# Patient Record
Sex: Female | Born: 1995 | Hispanic: No | Smoking: Never smoker
Health system: Southern US, Community
[De-identification: ages and names within clinical notes are randomized; demographics above are authoritative.]

## PROBLEM LIST (undated history)

## (undated) ENCOUNTER — Emergency Department (HOSPITAL_COMMUNITY): Payer: Self-pay

## (undated) ENCOUNTER — Inpatient Hospital Stay (HOSPITAL_COMMUNITY): Payer: Self-pay

## (undated) DIAGNOSIS — Z789 Other specified health status: Secondary | ICD-10-CM

## (undated) HISTORY — PX: NO PAST SURGERIES: SHX2092

---

## 2020-09-25 ENCOUNTER — Emergency Department (HOSPITAL_COMMUNITY): Payer: Self-pay

## 2020-09-25 ENCOUNTER — Other Ambulatory Visit: Payer: Self-pay

## 2020-09-25 ENCOUNTER — Emergency Department (HOSPITAL_COMMUNITY)
Admission: EM | Admit: 2020-09-25 | Discharge: 2020-09-25 | Disposition: A | Discharge status: Home / Self Care | Payer: Self-pay | Attending: Emergency Medicine | Admitting: Emergency Medicine

## 2020-09-25 DIAGNOSIS — R112 Nausea with vomiting, unspecified: Secondary | ICD-10-CM | POA: Insufficient documentation

## 2020-09-25 DIAGNOSIS — R519 Headache, unspecified: Secondary | ICD-10-CM | POA: Insufficient documentation

## 2020-09-25 DIAGNOSIS — R55 Syncope and collapse: Secondary | ICD-10-CM | POA: Insufficient documentation

## 2020-09-25 DIAGNOSIS — Z5321 Procedure and treatment not carried out due to patient leaving prior to being seen by health care provider: Secondary | ICD-10-CM | POA: Insufficient documentation

## 2020-09-25 LAB — CBC WITH DIFFERENTIAL/PLATELET
Abs Immature Granulocytes: 0.04 10*3/uL (ref 0.00–0.07)
Basophils Absolute: 0 10*3/uL (ref 0.0–0.1)
Basophils Relative: 0 %
Eosinophils Absolute: 0.1 10*3/uL (ref 0.0–0.5)
Eosinophils Relative: 1 %
HCT: 40 % (ref 36.0–46.0)
Hemoglobin: 12.7 g/dL (ref 12.0–15.0)
Immature Granulocytes: 0 %
Lymphocytes Relative: 15 %
Lymphs Abs: 1.6 10*3/uL (ref 0.7–4.0)
MCH: 26.9 pg (ref 26.0–34.0)
MCHC: 31.8 g/dL (ref 30.0–36.0)
MCV: 84.7 fL (ref 80.0–100.0)
Monocytes Absolute: 0.8 10*3/uL (ref 0.1–1.0)
Monocytes Relative: 8 %
Neutro Abs: 8.1 10*3/uL — ABNORMAL HIGH (ref 1.7–7.7)
Neutrophils Relative %: 76 %
Platelets: 224 10*3/uL (ref 150–400)
RBC: 4.72 MIL/uL (ref 3.87–5.11)
RDW: 12.3 % (ref 11.5–15.5)
WBC: 10.6 10*3/uL — ABNORMAL HIGH (ref 4.0–10.5)
nRBC: 0 % (ref 0.0–0.2)

## 2020-09-25 LAB — COMPREHENSIVE METABOLIC PANEL
ALT: 22 U/L (ref 0–44)
AST: 16 U/L (ref 15–41)
Albumin: 3.6 g/dL (ref 3.5–5.0)
Alkaline Phosphatase: 52 U/L (ref 38–126)
Anion gap: 6 (ref 5–15)
BUN: 14 mg/dL (ref 6–20)
CO2: 24 mmol/L (ref 22–32)
Calcium: 8.6 mg/dL — ABNORMAL LOW (ref 8.9–10.3)
Chloride: 106 mmol/L (ref 98–111)
Creatinine, Ser: 0.67 mg/dL (ref 0.44–1.00)
GFR, Estimated: >60 mL/min (ref >60)
Glucose, Bld: 123 mg/dL — ABNORMAL HIGH (ref 70–99)
Potassium: 3.7 mmol/L (ref 3.5–5.1)
Sodium: 136 mmol/L (ref 135–145)
Total Bilirubin: 0.6 mg/dL (ref 0.3–1.2)
Total Protein: 6.5 g/dL (ref 6.5–8.1)

## 2020-09-25 LAB — I-STAT BETA HCG BLOOD, ED (MC, WL, AP ONLY): I-stat hCG, quantitative: <5 m[IU]/mL (ref <5)

## 2020-09-25 LAB — TROPONIN I (HIGH SENSITIVITY): Troponin I (High Sensitivity): <2 ng/L (ref <18)

## 2020-09-25 NOTE — ED Triage Notes (Signed)
Pt via EMS from home for witnessed syncopal episode at home. +orthostatic changes with EMS. Given 500cc NS by EMS. Has had a lot of coffee and little water this morning.

## 2020-09-25 NOTE — ED Notes (Signed)
Patient boyfriend stated that patient did not wont to stay, and asked to have IV removed. I stated that we would see ehr as soon as we had a room. He spoke to patient, and stated that she still would like to leave.

## 2020-09-25 NOTE — ED Notes (Signed)
Pt stated they wanted to leave and

## 2020-09-25 NOTE — ED Provider Notes (Signed)
Emergency Medicine Provider Triage Evaluation Note  Carolyn Carroll , a 25 y.o. female  was evaluated in triage.  Pt complains of syncope.  Patient was sitting at the table drinking coffee when she passed out from a sitting position.  Patient states since then, she has felt weak.  She has a mild headache which began after the syncope.  She did not have any chest pain before passing out.  She states for several days, she has had nausea and vomiting.    Review of Systems  Positive: Syncope, n/v, HA Negative: cp  Physical Exam  BP 104/69   Pulse 68   Temp 99.1 F (37.3 C)   Resp 17   SpO2 100%  Gen:   Awake, no distress   Resp:  Normal effort  MSK:   Moves extremities without difficulty  Other:  CN intact.  Grip strength equal.  Negative pronator drift.  Strength and sensation intact x4. No ttp of abd  Medical Decision Making  Medically screening exam initiated at 10:51 AM.  Appropriate orders placed.  Carolyn Carroll was informed that the remainder of the evaluation will be completed by another provider, this initial triage assessment does not replace that evaluation, and the importance of remaining in the ED until their evaluation is complete.  Labs, ct , cxr, ekg, ua ordered   Carolyn Apley, PA-C 09/25/20 1052    Carroll, Carolyn Brim, MD 09/25/20 [phone]

## 2021-03-06 NOTE — L&D Delivery Note (Addendum)
Delivery Note ?Carolyn Carroll [name] is a 26 y.o. G3P2002 at [redacted]w[redacted]d admitted for SOL.  ? ?GBS Status: Negative/-- (04/26 1621) ?Maximum Maternal Temperature: 98.3 F ? ?Labor course: Initial SVE: 9/90/-1 Bulging bag. Augmentation with: N/A. She then progressed to complete.  ?ROM: 0h 69m with clear fluid ? ?Birth: At 1721 a viable female was delivered via spontaneous vaginal delivery en caul (Presentation: ROA ). Nuchal cord present: No.  Shoulders and body delivered in usual fashion. Infant placed directly on mom's abdomen for bonding/skin-to-skin, baby dried and stimulated. Cord clamped x 2 after 1 minute and cut by FOB.  Cord blood collected.  The placenta separated spontaneously and delivered schultz via gentle cord traction.  Pitocin infused rapidly IV per protocol.  Fundus firm with massage.  ?Placenta inspected and appears to be intact with a 3 VC.  Placenta/Cord with the following complications: N/A .  Cord pH: N/A ?Sponge and instrument count were correct x2. ? ?Intrapartum complications:  None ?Anesthesia:  none ?Episiotomy: none ?Lacerations:  none ?Suture Repair:  N/A ?EBL (mL): 50 ? ? ?Infant: ?APGAR (1 MIN): 9   ?APGAR (5 MINS): 9   ?APGAR (10 MINS):    ?Infant weight: pending ? ?Mom to postpartum.  Baby to Couplet care / Skin to Skin. Placenta to L&D   ?Plans to Breast and bottlefeed ?Contraception:  undecided ?Circumcision: declines ? ?Note sent to [facility]: MCW for pp visit. ? ?Karl Pock , SNM ?07/03/2021 ?5:57 PM ? ?  ?

## 2021-03-09 ENCOUNTER — Encounter (HOSPITAL_COMMUNITY): Payer: Self-pay | Admitting: Obstetrics and Gynecology

## 2021-03-09 ENCOUNTER — Other Ambulatory Visit: Payer: Self-pay

## 2021-03-09 ENCOUNTER — Inpatient Hospital Stay (HOSPITAL_COMMUNITY)
Admission: AD | Admit: 2021-03-09 | Discharge: 2021-03-09 | Disposition: A | Discharge status: Home / Self Care | Payer: Self-pay | Attending: Obstetrics and Gynecology | Admitting: Obstetrics and Gynecology

## 2021-03-09 DIAGNOSIS — K5900 Constipation, unspecified: Secondary | ICD-10-CM | POA: Insufficient documentation

## 2021-03-09 DIAGNOSIS — M545 Low back pain, unspecified: Secondary | ICD-10-CM | POA: Insufficient documentation

## 2021-03-09 DIAGNOSIS — O0932 Supervision of pregnancy with insufficient antenatal care, second trimester: Secondary | ICD-10-CM | POA: Insufficient documentation

## 2021-03-09 DIAGNOSIS — R1030 Lower abdominal pain, unspecified: Secondary | ICD-10-CM | POA: Insufficient documentation

## 2021-03-09 DIAGNOSIS — O99891 Other specified diseases and conditions complicating pregnancy: Secondary | ICD-10-CM

## 2021-03-09 DIAGNOSIS — O26892 Other specified pregnancy related conditions, second trimester: Secondary | ICD-10-CM | POA: Insufficient documentation

## 2021-03-09 DIAGNOSIS — M549 Dorsalgia, unspecified: Secondary | ICD-10-CM

## 2021-03-09 DIAGNOSIS — Z3A17 17 weeks gestation of pregnancy: Secondary | ICD-10-CM | POA: Insufficient documentation

## 2021-03-09 HISTORY — DX: Other specified health status: Z78.9

## 2021-03-09 LAB — COMPREHENSIVE METABOLIC PANEL
ALT: 17 U/L (ref 0–44)
AST: 14 U/L — ABNORMAL LOW (ref 15–41)
Albumin: 2.9 g/dL — ABNORMAL LOW (ref 3.5–5.0)
Alkaline Phosphatase: 54 U/L (ref 38–126)
Anion gap: 8 (ref 5–15)
BUN: 9 mg/dL (ref 6–20)
CO2: 22 mmol/L (ref 22–32)
Calcium: 8.5 mg/dL — ABNORMAL LOW (ref 8.9–10.3)
Chloride: 104 mmol/L (ref 98–111)
Creatinine, Ser: 0.47 mg/dL (ref 0.44–1.00)
GFR, Estimated: >60 mL/min (ref >60)
Glucose, Bld: 98 mg/dL (ref 70–99)
Potassium: 3.5 mmol/L (ref 3.5–5.1)
Sodium: 134 mmol/L — ABNORMAL LOW (ref 135–145)
Total Bilirubin: 0.3 mg/dL (ref 0.3–1.2)
Total Protein: 6 g/dL — ABNORMAL LOW (ref 6.5–8.1)

## 2021-03-09 LAB — URINALYSIS, ROUTINE W REFLEX MICROSCOPIC
Bilirubin Urine: NEGATIVE
Glucose, UA: NEGATIVE mg/dL
Hgb urine dipstick: NEGATIVE
Ketones, ur: NEGATIVE mg/dL
Leukocytes,Ua: NEGATIVE
Nitrite: NEGATIVE
Protein, ur: NEGATIVE mg/dL
Specific Gravity, Urine: >1.03 — ABNORMAL HIGH (ref 1.005–1.030)
pH: 5.5 (ref 5.0–8.0)

## 2021-03-09 LAB — GC/CHLAMYDIA PROBE AMP (~~LOC~~) NOT AT ARMC
Chlamydia: NEGATIVE
Comment: NEGATIVE
Comment: NORMAL
Neisseria Gonorrhea: NEGATIVE

## 2021-03-09 LAB — CBC
HCT: 33.4 % — ABNORMAL LOW (ref 36.0–46.0)
Hemoglobin: 11.3 g/dL — ABNORMAL LOW (ref 12.0–15.0)
MCH: 28.1 pg (ref 26.0–34.0)
MCHC: 33.8 g/dL (ref 30.0–36.0)
MCV: 83.1 fL (ref 80.0–100.0)
Platelets: 214 10*3/uL (ref 150–400)
RBC: 4.02 MIL/uL (ref 3.87–5.11)
RDW: 13.2 % (ref 11.5–15.5)
WBC: 14.7 10*3/uL — ABNORMAL HIGH (ref 4.0–10.5)
nRBC: 0 % (ref 0.0–0.2)

## 2021-03-09 LAB — ABO/RH: ABO/RH(D): O POS

## 2021-03-09 LAB — WET PREP, GENITAL
Sperm: NONE SEEN
Trich, Wet Prep: NONE SEEN
WBC, Wet Prep HPF POC: <10 (ref <10)
Yeast Wet Prep HPF POC: NONE SEEN

## 2021-03-09 MED ORDER — CYCLOBENZAPRINE HCL 5 MG PO TABS
ORAL_TABLET | ORAL | Status: AC
  Filled 2021-03-09: qty 1

## 2021-03-09 MED ORDER — CYCLOBENZAPRINE HCL 5 MG PO TABS: 5.0000 mg | ORAL_TABLET | Freq: Three times a day (TID) | ORAL | 0 refills | Status: DC | PRN

## 2021-03-09 MED ORDER — CYCLOBENZAPRINE HCL 5 MG PO TABS
5.0000 mg | ORAL_TABLET | Freq: Once | ORAL | Status: AC
  Administered 2021-03-09: 5 mg via ORAL

## 2021-03-09 NOTE — MAU Note (Signed)
Having lower back and lower abd pain since 2330. Denies any VB. Pain is crampy. LMP 11/06/20. Has not started care yet. Has had 2 normal vag del in [place].

## 2021-03-09 NOTE — MAU Provider Note (Signed)
Chief Complaint:  Abdominal Pain and Back Pain   Event Date/Time   First Provider Initiated Contact with Patient 03/09/21 0136    Interpretor used throughout   HPI: Carolyn Carroll is a 26 y.o. G3P2002 at [id number] presents to maternity admissions reporting low back pain which wraps around to middle abdomen. No pelvic pain.  Normal vaginal discharge, no bleeding. . She denies LOF, vaginal bleeding, vaginal itching/burning, urinary symptoms, h/a, dizziness, n/v, diarrhea, constipation or fever/chills.   Abdominal Pain This is a new problem. The current episode started today. The problem occurs constantly. The problem has been unchanged. The quality of the pain is cramping and aching. The abdominal pain does not radiate. Associated symptoms include constipation. Pertinent negatives include no diarrhea, dysuria, fever, frequency, headaches or nausea. Nothing aggravates the pain. The pain is relieved by Nothing. She has tried nothing for the symptoms.  Back Pain This is a new problem. The current episode started today. The problem is unchanged. The quality of the pain is described as aching and cramping. Stiffness is present All day. Associated symptoms include abdominal pain. Pertinent negatives include no dysuria, fever, headaches or paresis. She has tried nothing for the symptoms.   RN note: Having lower back and lower abd pain since 2330. Denies any VB. Pain is crampy. LMP 11/06/20. Has not started care yet. [place] 2 [place] del [place]   Past Medical History: Past Medical History:  Diagnosis Date   Medical history non-contributory     Past obstetric history: OB History  Gravida Para Term Preterm AB Living  3 2 2     2   SAB IAB Ectopic Multiple Live Births          2    # Outcome Date GA Lbr Len/2nd Weight Sex Delivery Anes PTL Lv  3 Current           2 Term 04/13/17    M Vag-Spont   LIV  1 Term 06/30/12    Genella MechM Vag-Spont   LIV    Past Surgical History: Past  Surgical History:  Procedure Laterality Date   NO PAST SURGERIES      Family History: History reviewed. No pertinent family history.  Social History: Social History   Tobacco Use   Smoking status: Never   Smokeless tobacco: Never  Vaping Use   Vaping Use: Never used  Substance Use Topics   Alcohol use: Never   Drug use: Never    Allergies: Not on File  Meds:  Medications Prior to Admission  Medication Sig Dispense Refill Last Dose   Prenatal Vit-Fe Fumarate-FA (PRENATAL MULTIVITAMIN) TABS tablet Take 1 tablet by mouth daily at 12 noon.   03/08/2021    I have reviewed patient's Past Medical Hx, Surgical Hx, Family Hx, Social Hx, medications and allergies.   ROS:  Review of Systems  Constitutional:  Negative for fever.  Gastrointestinal:  Positive for abdominal pain and constipation. Negative for diarrhea and nausea.  Genitourinary:  Negative for dysuria and frequency.  Musculoskeletal:  Positive for back pain.  Neurological:  Negative for headaches.  Other systems negative  Physical Exam  Patient Vitals for the past 24 hrs:  BP Temp Pulse Resp SpO2 Height Weight  03/09/21 0045 117/75 (!) 97.4 F (36.3 C) 71 16 100 % 4\' 9"  (1.448 m) 50.3 kg   Constitutional: Well-developed, well-nourished female in no acute distress.  Cardiovascular: normal rate and rhythm Respiratory: normal effort, clear to auscultation bilaterally GI: Abd soft,  non-tender, gravid appropriate for gestational age.   No rebound or guarding. MS: Extremities nontender, no edema, normal ROM   + muscle tension in mid back muscles Neurologic: Alert and oriented x 4.  GU: Neg CVAT.  PELVIC EXAM: No bleeding or abnormal discharge   Bimanual exam: Cervix firm, posterior, neg CMT, uterus nontender, Fundal Height consistent with dates, adnexa without tenderness, enlargement, or mass    FHT:   147  Labs: Results for orders placed or performed during the hospital encounter of 03/09/21 (from the past 24  hour(s))  Urinalysis, Routine w reflex microscopic Urine, Clean Catch     Status: Abnormal   Collection Time: 03/09/21 12:56 AM  Result Value Ref Range   Color, Urine YELLOW YELLOW   APPearance CLEAR CLEAR   Specific Gravity, Urine >1.030 (H) 1.005 - 1.030   pH 5.5 5.0 - 8.0   Glucose, UA NEGATIVE NEGATIVE mg/dL   Hgb urine dipstick NEGATIVE NEGATIVE   Bilirubin Urine NEGATIVE NEGATIVE   Ketones, ur NEGATIVE NEGATIVE mg/dL   Protein, ur NEGATIVE NEGATIVE mg/dL   Nitrite NEGATIVE NEGATIVE   Leukocytes,Ua NEGATIVE NEGATIVE  Wet prep, genital     Status: Abnormal   Collection Time: 03/09/21  1:28 AM   Specimen: PATH Cytology Cervicovaginal Ancillary Only  Result Value Ref Range   Yeast Wet Prep HPF POC NONE SEEN NONE SEEN   Trich, Wet Prep NONE SEEN NONE SEEN   Clue Cells Wet Prep HPF POC PRESENT (A) NONE SEEN   WBC, Wet Prep HPF POC <10 <10   Sperm NONE SEEN   CBC     Status: Abnormal   Collection Time: 03/09/21  2:23 AM  Result Value Ref Range   WBC 14.7 (H) 4.0 - 10.5 K/uL   RBC 4.02 3.87 - 5.11 MIL/uL   Hemoglobin 11.3 (L) 12.0 - 15.0 g/dL   HCT 09.8 (L) 11.9 - 14.7 %   MCV 83.1 80.0 - 100.0 fL   MCH 28.1 26.0 - 34.0 pg   MCHC 33.8 30.0 - 36.0 g/dL   RDW 82.9 56.2 - 13.0 %   Platelets 214 150 - 400 K/uL   nRBC 0.0 0.0 - 0.2 %  ABO/Rh     Status: None   Collection Time: 03/09/21  2:23 AM  Result Value Ref Range   ABO/RH(D)      O POS Performed at [facility], [address] [place], [place] [address]   Comprehensive metabolic panel     Status: Abnormal   Collection Time: 03/09/21  2:23 AM  Result Value Ref Range   Sodium 134 (L) 135 - 145 mmol/L   Potassium 3.5 3.5 - 5.1 mmol/L   Chloride 104 98 - 111 mmol/L   CO2 22 22 - 32 mmol/L   Glucose, Bld 98 70 - 99 mg/dL   BUN 9 6 - 20 mg/dL   Creatinine, Ser 4.69 0.44 - 1.00 mg/dL   Calcium 8.5 (L) 8.9 - 10.3 mg/dL   Total Protein 6.0 (L) 6.5 - 8.1 g/dL   Albumin 2.9 (L) 3.5 - 5.0 g/dL   AST 14 (L) 15 - 41  U/L   ALT 17 0 - 44 U/L   Alkaline Phosphatase 54 38 - 126 U/L   Total Bilirubin 0.3 0.3 - 1.2 mg/dL   GFR, Estimated >62 >95 mL/min   Anion gap 8 5 - 15     Imaging:  No results found.  MAU Course/MDM: I have ordered labs and reviewed  results. They are normal   Treatments in MAU included Flexeril.  This did relieve her pain.  Discussed warning signs to return for.  Assessment: Single IUP at [redacted]w[redacted]d Back pain related to spasm No prenatal care  Plan: Discharge home Rx Flexeril for prn use at home for spasm Encouraged to start prenatal care Preterm Labor precautions and fetal kick counts Follow up in Office for prenatal visits  Encouraged to return if she develops worsening of symptoms, increase in pain, fever, or other concerning symptoms.  Pt stable at time of discharge.  Wynelle Bourgeois CNM, MSN Certified Nurse-Midwife 03/09/2021 1:36 AM

## 2021-04-19 ENCOUNTER — Other Ambulatory Visit (HOSPITAL_COMMUNITY)
Admission: RE | Admit: 2021-04-19 | Discharge: 2021-04-19 | Disposition: A | Discharge status: Home / Self Care | Payer: Self-pay | Source: Ambulatory Visit | Attending: Family Medicine | Admitting: Family Medicine

## 2021-04-19 ENCOUNTER — Other Ambulatory Visit: Payer: Self-pay

## 2021-04-19 ENCOUNTER — Ambulatory Visit (INDEPENDENT_AMBULATORY_CARE_PROVIDER_SITE_OTHER): Payer: Self-pay

## 2021-04-19 DIAGNOSIS — Z3480 Encounter for supervision of other normal pregnancy, unspecified trimester: Secondary | ICD-10-CM | POA: Insufficient documentation

## 2021-04-19 NOTE — Progress Notes (Signed)
New OB Intake  I connected with  Clearence Cheek on 04/19/21 at  8:15 AM EST by In Person Visit and verified that I am speaking with the correct person using two identifiers. Nurse is located at [facility] and pt is located at [employer] [facility].  I discussed the limitations, risks, security and privacy concerns of performing an evaluation and management service by telephone and the availability of in person appointments. I also discussed with the patient that there may be a patient responsible charge related to this service. The patient expressed understanding and agreed to proceed.  I explained I am completing New OB Intake today. We discussed her EDD of 08/13/21 that is based on LMP of 11/06/20. Pt is G3/P2. I reviewed her allergies, medications, Medical/Surgical/OB history, and appropriate screenings. I informed her of [facility] services. Based on history, this is a/an  pregnancy uncomplicated .   Patient Active Problem List   Diagnosis Date Noted   Back pain affecting pregnancy 03/09/2021    Concerns addressed today  Delivery Plans:  Plans to deliver at [facility].   MyChart/Babyscripts MyChart access verified. I explained pt will have some visits in office and some virtually. Babyscripts instructions given and order placed. Patient verifies receipt of registration text/e-mail. Account successfully created and app downloaded.  Blood Pressure Cuff  Patient is self-pay; explained patient will be given BP cuff at first prenatal appt. Explained after first prenatal appt pt will check weekly and document in 66.  Weight scale: Patient does / does not  have weight scale. Weight scale ordered for patient to pick up from [employer].   Anatomy US Explained first scheduled [place] will be around 19 weeks. Anatomy US scheduled for 05/04/21 at 0830a. Pt notified to arrive at 0815a. Scheduled AFP lab only appointment if CenteringPregnancy pt for same day as anatomy US.   Labs Discussed Johnsie Cancel genetic screening  with patient. Would like both Panorama and Horizon drawn at new OB visit.Also if interested in genetic testing, tell patient she will need AFP 15-21 weeks to complete genetic testing .Routine prenatal labs needed.  Covid Vaccine Patient has covid vaccine.   CenteringPregnancy Candidate?  If yes, offer as possibility  Mother/ Baby Dyad Candidate?    If yes, offer as possibility  Informed patient of [facility] website  and placed link in her AVS.   Social Determinants of Health Food Insecurity: Patient expresses food insecurity. Food Market information given to patient; explained patient may visit at the end of first OB appointment. WIC Referral: Patient is interested in referral to [facility].  Transportation: Patient denies transportation needs. Childcare: Discussed no children allowed at ultrasound appointments. Offered childcare services; patient declines childcare services at this time.  Send link to Pregnancy Navigators   Placed OB Box on problem list and updated  First visit review I reviewed new OB appt with pt. I explained she will have a pelvic exam, ob bloodwork with genetic screening, and PAP smear. Explained pt will be seen by Dr. Roselie Awkward at first visit; encounter routed to appropriate provider. Explained that patient will be seen by pregnancy navigator following visit with provider. [facility] information placed in AVS.   Bethanne Ginger, [place] 04/19/2021  8:31 AM

## 2021-04-19 NOTE — Patient Instructions (Signed)
AREA PEDIATRIC/FAMILY PRACTICE PHYSICIANS  Central/[place]outheast [place] ([address]) [facility] Chambliss, MD; Eniola, MD; Hale, MD; Hensel, MD; McDiarmid, MD; McIntyer, MD; Ashyia Schraeder, MD; Walden, MD [address] [place], [place] [address] [phone] Mon-Fri 8:30-12:30, 1:30-5:00 Providers come to see babies at [facility] Accepting Medicaid [facility] providers who accept newborns: Koirala, MD; Morrow, MD; Wolters, MD [address], [place], [place] [address] [phone] Mon-Fri 8:00-5:30 Babies seen by providers at [facility] Does NOT accept Medicaid Please call early in hospitalization for appointment (limited availability)  [facility] Mulberry, MD [address] [place], [place] [address] [phone] Mon, Tue, Thur, Fri 8:30-5:00, Wed 10:00-7:00 (closed 1-2pm) Babies seen by [facility] providers Accepting Medicaid Rubin - Pediatrician Rubin, MD [address], [place], [place] [address] [phone] Mon-Fri 8:30-5:00, Sat 8:30-12:00 Provider comes to see babies at [facility] Accepting Medicaid Must have been referred from current patients or contacted office prior to delivery [facility] ([facility]) Brown, MD; Chandler, MD; Ettefagh, MD; Grant, MD; Lester, MD; McCormick, MD; McQueen, MD; Prose, MD; Simha, MD; Stanley, MD; Stryffeler, NP; Tebben, NP [address], [place], [place] [address] [phone] Mon, Tue, Thur, Fri 8:30-5:30, Wed 9:30-5:30, Sat 8:30-12:30 Babies seen by [facility] providers Accepting Medicaid Only accepting infants of first-time parents or siblings of current patients Hospital discharge coordinator will make follow-up appointment Jack Amos [address], [place], [place]  [address] [phone]   Fax - [phone] [facility] [address], [place], [place]  [address] Phone - [phone]   Fax - [phone] Shilpa Gosrani [address], [place], [place]  [address] [phone]  [place]heast [place] ([address]) [facility] Bates, MD; Brassfield, MD; Cooper, Cox, MD; MD; Davis, MD; Dovico, MD; Ettefaugh, MD; Little, MD; Lowe, MD; Keiffer, MD; Melvin, MD; Sumner, MD; Williams, MD [address], [place], [place] [address] [phone] Mon-Fri 8:30-5:00 (extended evenings Mon-Thur as needed), Sat-Sun 10:00-1:00 Providers come to see babies at [facility] Accepting Medicaid for families of first-time babies and families with all children in the household age 3 and under. Must register with office prior to making appointment (M-F only). [facility] Henson, NP; Knapp, MD; Lalonde, MD; Tysinger, PA [address] [place], [place] [address] [phone] Mon-Fri 8:00-5:00 Babies seen by providers at [facility] Does NOT accept Medicaid/Commercial Insurance Only [facility] ([facility])  Artis, MD; Barnes, MD; Bratton, MD; Coccaro, MD; Lockett Gardner, MD; Kramer, MD; Marshall, MD; Netherton, MD; Poleto, MD; Skinner, MD [address] [place], [place] [address] [phone] Mon-Fri 8:30-5:30, Sat (Oct.-Mar.) 9:00-1:00 Babies seen by providers at [facility] Accepting Medicaid  [place] ([address]) [facility] Reid, MD; Warner, MD [address], [place], [place] [address] [phone] Mon-Fri 8:30-5:00, Sat 8:30-12:00 Providers come to see babies at [facility] Does NOT accept Medicaid [facility] Becker, PA; Hagler, MD; Scifres, PA; Sun, MD; Swayne, MD [address], [place], [place] [address] [phone] Mon-Fri 8:00-5:00 Babies seen by providers at [facility] Does NOT accept Medicaid Only accepting babies of parents who  are patients Please call early in hospitalization for appointment (limited availability)  Pediatricians Clark, MD; Frye, MD; Kelleher, MD; Mack, NP; Miller, MD; O'Keller, MD; Patterson, NP; Pudlo, MD; Puzio, MD; Thomas, MD; Tucker, MD; Twiselton, MD [address], [place], [place] [address] [phone] Mon-Fri 8:00-5:00, Sat 9:00-12:00 Providers come to see babies at [facility] Does NOT accept Medicaid  [place] ([address]) [facility] providers accepting new patients: Brake, NP; Wharton, PA [address], [place], [place] [address] [phone] Mon-Fri 8:00-5:00 Babies seen by providers at [facility] Does NOT accept Medicaid Only accepting babies of parents who are patients Please call early in hospitalization for appointment (limited availability) [facility] Gay, MD; Quinlan, MD [address] [place], [place] [address] [phone] (press [phone] to schedule appointment) Mon-Fri 8:00-5:00 Providers come to see babies at [facility] Does NOT accept Medicaid [facility] Mazer, MD [address] [place], [place] [address] [phone] Mon-Fri 8:30-5:00 (lunch 12:30-1:00), extended hours by appointment only Wed 5:00-6:30 Babies seen by [facility] providers Accepting Medicaid [facility] Banks, MD; Jordan, MD; Koberlein, MD [address], [place], [place] [address] [phone] Mon-Fri 8:00-5:00 Babies seen by [facility] providers Does NOT accept Medicaid [facility] Parker, MD; Hunter, MD; Wallace, DO [address] [place], [place] [address] [phone] Mon-Fri 8:00-5:00 Babies seen by [facility] providers Does NOT accept Medicaid [facility] Brandon, PA; Brecken, PA; Christy, NP; Dees, MD; DeClaire, MD; DeWeese, MD; Hansen, NP; Mills, NP; Parrish, NP; Smoot, NP; Summer, MD; Vapne,  MD [address] [place], [place] [address] [phone] Mon-Fri 8:30-5:00, Sat 10:00-1:00 Providers come to see babies at [facility] Does NOT accept Medicaid Free prenatal information session Tuesdays at 4:45pm [facility] Bouska, MD; Gordon, PA; Jeffery, PA; Weber, PA [address] [place] [address] [phone] Mon-Fri 7:30-5:30 Babies seen by [facility] providers [facility] [address], [place], [place]  [address] [phone]   Fax - [phone]  [place] ([address] & [address]) [facility] [place] [address] [place], [place] [address] [phone] Mon-Thur 8:00-6:00 Providers come to see babies at [facility] Accepting Medicaid [facility] Anderson, NP; Badger, MD; Beal, PA; Spencer, PA [address] [place], [place] [address] [phone] Mon-Thur 7:30-7:30, Fri 7:30-4:30 Babies seen by [facility] providers Accepting Medicaid [facility] Agbuya, MD; Klett, NP; Romgoolam, MD [address], [place], [place] [address] [phone] Mon-Fri 8:30-5:00, Sat 8:30-12:00 Providers come to see babies at [facility] Accepting Medicaid Must have "Meet & Greet" appointment at office prior to delivery [facility] ([facility]) McCord, MD; Wallace, MD; Wood, MD [address], [place], [place] [address] [phone] Mon-Wed 8:00-6:00, Thur-Fri 8:00-5:00, Sat 9:00-12:00 Providers come to see babies at [facility] Does NOT accept Medicaid Only accepting siblings of current patients [facility]  [address], [place], [place]  [address] [phone]   Fax - [phone] [facility] [address], [place], [place]  [address] [phone]   Fax -  [phone]  [place] ([address] & [address]) [facility] Cirigliano, DO; Matthews, DO [address] [place], [place] [address] [phone] Mon-Fri 7:00-5:00 Babies seen by [facility] providers Does NOT accept Medicaid [facility] Briscoe, MD; Howley, PA; Moreira, PA [address], [place],  [address] [phone] Mon-Fri 8:00-5:00 Babies seen by [facility] providers Accepting Medicaid [facility] Boyd, MD; Church, PA; Jones, NP; Osborn, PA [address], ,  [address] [phone] Mon-Fri 8:00-5:00 Babies seen by providers at [facility] Accepting Medicaid  [place] ([address]) [facility] [place] Wendling, DO [address] [place], [place] [address] [phone] Mon-Fri 8:00-5:00 Babies seen by [facility] providers Does NOT accept Medicaid Limited availability, please call early in hospitalization to schedule follow-up [facility] Calderon, PA; Cummings, MD; Dillard, MD; Martin, PA; Olson, MD; VanDeven, PA [address], [place], [place] [address] [phone] Mon-Fri 8:30-5:00, Sat 9:00-12:00 Babies seen by providers at [facility] Accepting Medicaid Please register online then schedule online or call office [email or web address] Hunter, NP; Kumar, MD; Martin Rogers, PA [address], [place], [place] [address] [phone] Mon-Fri 8:00-5:00 Babies seen by providers at [facility] Accepting Medicaid [facility] ([facility]) Hilmar-Irwin, MD; Kristi Fleenor, NP; West, MD [address], [place], [place] [address] [phone] Mon-Fri 8:00-5:30, Sat&Sun by appointment (phones open at  8:30) Babies seen by [facility] providers Accepting Medicaid Must be a first-time baby or sibling of current patient [facility] [place]  [address], [place], [place]  [address] [phone]   Fax - [phone]  [place] ([address]) [facility] Brown, PA; Cowen, PA; Rice, MD; Helton, PA; Spry, MD [address] [place], [place] [address] [phone] Mon-Thur 8:00-7:00, Fri 8:00-5:00, Sat 8:00-12:00, Sun 9:00-12:00 Babies seen by [facility] providers Accepting Medicaid [facility] Coe-Goins, MD; Marshall, MD; Pierre-Louis, MD [address], [place], [place] [address] [phone] Mon-Thur 8:00-5:00 Babies seen by providers at [facility] Accepting Medicaid [facility] Bratton, MD; Coe-Goins, MD; Hayes, MD; Lewis, MD; List, MD; Lott, MD; Marshall, MD; Moran, MD; O'Josephina Melcher, MD; Pierre-Louis, MD; Pitonzo, MD; Scholer, MD; Spangle, MD [address] [place], [place] [address] [phone] Mon-Fri 8:00-5:30, Sat (Oct.-Mar.) 9:00-1:00 Babies seen by providers at [facility] Accepting Medicaid Must fill out new patient packet, available online at [email or web address] [facility] ([facility]) Friddle, NP; Harris, NP; Kelly, NP; Logan, MD; Melvin, PA; Poth, MD; Ramadoss, MD; Stanton, NP [address], [place], [place] [address] [phone] Mon-Thur 8:00-5:30, Fri 8:00-5:00 Babies seen by providers at [facility] Accepting Medicaid  [place] ([address]) [facility] Dixon, PA; Fate, MD; Pickard, MD; Tapia, PA [address], [place], [place] [address] [phone] Mon-Fri 8:00-5:00 Babies seen by providers at [facility] Accepting Medicaid   [place] ([address]) [facility] [place] Masneri, DO; Meyers, MD; Nelson, PA [address], [place], [place] [address] [phone] Mon-Fri 8:00-5:00 Babies seen by providers at [facility] Does NOT accept Medicaid Limited appointment availability, please call early in hospitalization  [facility] [place] Kunedd, DO; McGowen, MD [address], [place], [place] [address] [phone] Mon-Fri 8:00-5:00 Babies seen by [facility] providers Does NOT accept Medicaid [facility] [place] Cameron, MD; MacDonald, MD; Michaels, PA; Nayak, MD [address] [place] [address], [place], [place] [address] [phone] Mon-Fri 8:00-5:00 After hours clinic ([address] [place],  [address]) [phone] Mon-Fri 5:00-8:00, Sat 12:00-6:00, Sun 10:00-4:00 Babies seen by [facility] providers Accepting Medicaid [facility] [place] [address], [place], [place]  [address] [phone]   Fax - [phone]  [place] ([address]) [facility] [place] [facility] Andy, MD [address], [place], [place] [address] [phone] Mon-Fri 8:00-5:00 Babies seen by [facility] providers Does NOT accept Medicaid [facility] [place] ([facility] [place]) Eksir, MD [address], [place], [place] [address] [phone] Mon-Thur 8:00-7:00, Fri 8:00-5:00, Sat 8:00-12:00 Babies seen by providers at [facility] Accepting Medicaid - but does not have vaccinations in office (must be received elsewhere) Limited availability, please call early in hospitalization  [place] ([address]) [facility]  Charlene Flemming, MD [address] [place] [address], [place] [address] [phone]  Fax [phone]  [facility]  Kimberly Newton, MD, Annamarie Streilein, PA, Carla [place], PA [address] [place], [place]  [address] [phone] [facility]  [address], [place], [place] [address] [phone] [address], [place], [place] [address] [phone] (West Office)  [facility] [address], [place], [place] [address] [phone] [facility] [address], [place], [place] [address] [phone] [facility] [address], [place], [place] [address] [phone] [facility] [address], [place], [place] [address] [phone] [facility] [address], [place], [place] [address] [phone] [facility] [address], [place], [place] [address] [phone] [facility]  [address], [place], [place] [address] [phone] Dr[facility] [address] [place] [address], [place], [place] [address] [phone] [facility] [address], [place], [place] [address] [phone] [facility] [address], ,  [address] [phone]  

## 2021-04-20 LAB — CBC/D/PLT+RPR+RH+ABO+RUBIGG...
Antibody Screen: NEGATIVE
Basophils Absolute: 0 10*3/uL (ref 0.0–0.2)
Basos: 0 %
EOS (ABSOLUTE): 0 10*3/uL (ref 0.0–0.4)
Eos: 0 %
HCV Ab: NONREACTIVE
HIV Screen 4th Generation wRfx: NONREACTIVE
Hematocrit: 36.5 % (ref 34.0–46.6)
Hemoglobin: 12.1 g/dL (ref 11.1–15.9)
Hepatitis B Surface Ag: NEGATIVE
Immature Grans (Abs): 0 10*3/uL (ref 0.0–0.1)
Immature Granulocytes: 0 %
Lymphocytes Absolute: 1.3 10*3/uL (ref 0.7–3.1)
Lymphs: 18 %
MCH: 27.7 pg (ref 26.6–33.0)
MCHC: 33.2 g/dL (ref 31.5–35.7)
MCV: 84 fL (ref 79–97)
Monocytes Absolute: 0.7 10*3/uL (ref 0.1–0.9)
Monocytes: 10 %
Neutrophils Absolute: 4.9 10*3/uL (ref 1.4–7.0)
Neutrophils: 72 %
Platelets: 204 10*3/uL (ref 150–450)
RBC: 4.37 x10E6/uL (ref 3.77–5.28)
RDW: 12.5 % (ref 11.7–15.4)
RPR Ser Ql: NONREACTIVE
Rh Factor: POSITIVE
Rubella Antibodies, IGG: 6.5 index (ref >0.99)
WBC: 6.9 10*3/uL (ref 3.4–10.8)

## 2021-04-20 LAB — GC/CHLAMYDIA PROBE AMP (~~LOC~~) NOT AT ARMC
Chlamydia: NEGATIVE
Comment: NEGATIVE
Comment: NORMAL
Neisseria Gonorrhea: NEGATIVE

## 2021-04-20 LAB — HEMOGLOBIN A1C
Est. average glucose Bld gHb Est-mCnc: 108 mg/dL
Hgb A1c MFr Bld: 5.4 % (ref 4.8–5.6)

## 2021-04-20 LAB — HCV INTERPRETATION

## 2021-04-21 LAB — CULTURE, OB URINE

## 2021-04-21 LAB — URINE CULTURE, OB REFLEX

## 2021-05-04 ENCOUNTER — Other Ambulatory Visit: Payer: Self-pay

## 2021-05-04 ENCOUNTER — Ambulatory Visit: Payer: Self-pay | Attending: Obstetrics & Gynecology

## 2021-05-04 ENCOUNTER — Ambulatory Visit: Payer: Self-pay | Admitting: *Deleted

## 2021-05-04 ENCOUNTER — Encounter: Payer: Self-pay | Admitting: *Deleted

## 2021-05-04 ENCOUNTER — Other Ambulatory Visit: Payer: Self-pay | Admitting: *Deleted

## 2021-05-04 VITALS — BP 107/56 | HR 71

## 2021-05-04 DIAGNOSIS — Z3A29 29 weeks gestation of pregnancy: Secondary | ICD-10-CM | POA: Insufficient documentation

## 2021-05-04 DIAGNOSIS — Z3689 Encounter for other specified antenatal screening: Secondary | ICD-10-CM

## 2021-05-04 DIAGNOSIS — Z363 Encounter for antenatal screening for malformations: Secondary | ICD-10-CM | POA: Insufficient documentation

## 2021-05-04 DIAGNOSIS — O0933 Supervision of pregnancy with insufficient antenatal care, third trimester: Secondary | ICD-10-CM | POA: Insufficient documentation

## 2021-05-04 DIAGNOSIS — Z3493 Encounter for supervision of normal pregnancy, unspecified, third trimester: Secondary | ICD-10-CM

## 2021-05-04 DIAGNOSIS — Z3480 Encounter for supervision of other normal pregnancy, unspecified trimester: Secondary | ICD-10-CM

## 2021-05-09 ENCOUNTER — Encounter: Payer: Self-pay | Admitting: Obstetrics & Gynecology

## 2021-05-09 ENCOUNTER — Ambulatory Visit (INDEPENDENT_AMBULATORY_CARE_PROVIDER_SITE_OTHER): Payer: Self-pay | Admitting: Obstetrics & Gynecology

## 2021-05-09 ENCOUNTER — Other Ambulatory Visit: Payer: Self-pay

## 2021-05-09 DIAGNOSIS — Z3480 Encounter for supervision of other normal pregnancy, unspecified trimester: Secondary | ICD-10-CM

## 2021-05-09 MED ORDER — OMEPRAZOLE MAGNESIUM 20 MG PO TBEC: 20.0000 mg | DELAYED_RELEASE_TABLET | Freq: Every day | ORAL | 2 refills | Status: DC

## 2021-05-09 NOTE — Progress Notes (Signed)
?  Subjective:  ? ? Carolyn Carroll is a [id number] [redacted]w[redacted]d being seen today for her first obstetrical visit.  Her obstetrical history is significant for  h/o SGA baby . Patient does intend to breast feed. Pregnancy history fully reviewed. ? ?Patient reports  trouble sleeping . ? ?Vitals:  ? 05/09/21 0845  ?BP: (!) 99/58  ?Pulse: 66  ?Weight: 114 lb 6.4 oz (51.9 kg)  ? ? ?HISTORY: ?OB History  ?Gravida Para Term Preterm AB Living  ?3 2 2     2   ?SAB IAB Ectopic Multiple Live Births  ?        2  ?  ?# Outcome Date GA Lbr Len/2nd Weight Sex Delivery Anes PTL Lv  ?3 Current           ?2 Term 04/13/17   4 lb 8 oz (2.041 kg) M Vag-Spont   LIV  ?1 Term 06/30/12   7 lb 4 oz (3.289 kg) M Vag-Spont   LIV  ? ?Past Medical History:  ?Diagnosis Date  ? Medical history non-contributory   ? ?Past Surgical History:  ?Procedure Laterality Date  ? NO PAST SURGERIES    ? ?Family History  ?Problem Relation Age of Onset  ? Diabetes Neg Hx   ? Hypertension Neg Hx   ? ? ? ?Exam  ? ? Fundal Height: 30 cm  ?  ?Perineum: deferred  ?Vulva:   ?Vagina:    ?pH:   ?Cervix:   ?Adnexa: not evaluated  ?Bony Pelvis:   ?Breast:  normal appearance, no masses or tenderness  ?Skin: normal coloration and turgor, no rashes ?  ?Neurologic: normal  ?Extremities: normal strength, tone, and muscle mass  ?HEENT PERRLA  ?Mouth/Teeth   ?Neck supple  ?Cardiovascular: regular rate and rhythm  ?Respiratory:  appears well, vitals normal, no respiratory distress, acyanotic, normal RR  ?Abdomen: gravid  ?Urinary:   ? ?Uterus:   ?Pelvic Exam:   ?    ?    ?    ?    ?    ?    ?    ?System:    ?    ?    ?    ?    ?    ?    ?    ?    ?    ?    ? ? ?  ?Assessment:  ? ? Pregnancy: 07/02/12 ?Patient Active Problem List  ? Diagnosis Date Noted  ? Supervision of other normal pregnancy, antepartum 04/19/2021  ? Back pain affecting pregnancy 03/09/2021  ? ?  ? ?  ?Plan:  ? ?  ?Initial labs drawn. ?Prenatal vitamins. ?Problem list reviewed and updated. ?Genetic Screening  discussed : results reviewed. ? Ultrasound discussed; fetal survey: results reviewed. ? Follow up in 2 weeks. ?50% of 30 min visit spent on counseling and coordination of care.  ?Needs 2 hr GTT  ? ?05/07/2021 ?05/09/2021 ? ? ?

## 2021-05-13 ENCOUNTER — Other Ambulatory Visit: Payer: Self-pay

## 2021-05-24 ENCOUNTER — Other Ambulatory Visit: Payer: Self-pay | Admitting: General Practice

## 2021-05-24 ENCOUNTER — Encounter: Payer: Self-pay | Admitting: Family Medicine

## 2021-05-24 ENCOUNTER — Telehealth: Payer: Self-pay | Admitting: Family Medicine

## 2021-05-24 ENCOUNTER — Other Ambulatory Visit: Payer: Self-pay

## 2021-05-24 DIAGNOSIS — Z3480 Encounter for supervision of other normal pregnancy, unspecified trimester: Secondary | ICD-10-CM

## 2021-05-24 NOTE — Telephone Encounter (Signed)
I called patient regarding missed appointment and to reschedule. Patient's voicemail has not been setup yet. ?

## 2021-06-01 ENCOUNTER — Ambulatory Visit: Payer: Self-pay | Attending: Maternal & Fetal Medicine

## 2021-06-01 ENCOUNTER — Other Ambulatory Visit: Payer: Self-pay | Admitting: *Deleted

## 2021-06-01 ENCOUNTER — Ambulatory Visit: Payer: Self-pay | Admitting: *Deleted

## 2021-06-01 ENCOUNTER — Other Ambulatory Visit: Payer: Self-pay

## 2021-06-01 VITALS — BP 108/47 | HR 62

## 2021-06-01 DIAGNOSIS — O0933 Supervision of pregnancy with insufficient antenatal care, third trimester: Secondary | ICD-10-CM | POA: Insufficient documentation

## 2021-06-01 DIAGNOSIS — O365990 Maternal care for other known or suspected poor fetal growth, unspecified trimester, not applicable or unspecified: Secondary | ICD-10-CM

## 2021-06-01 DIAGNOSIS — Z3689 Encounter for other specified antenatal screening: Secondary | ICD-10-CM | POA: Insufficient documentation

## 2021-06-01 DIAGNOSIS — Z3A33 33 weeks gestation of pregnancy: Secondary | ICD-10-CM | POA: Insufficient documentation

## 2021-06-01 DIAGNOSIS — Z3493 Encounter for supervision of normal pregnancy, unspecified, third trimester: Secondary | ICD-10-CM | POA: Insufficient documentation

## 2021-06-28 ENCOUNTER — Ambulatory Visit: Payer: Self-pay | Admitting: *Deleted

## 2021-06-28 ENCOUNTER — Other Ambulatory Visit: Payer: Self-pay | Admitting: Obstetrics and Gynecology

## 2021-06-28 ENCOUNTER — Encounter: Payer: Self-pay | Admitting: *Deleted

## 2021-06-28 ENCOUNTER — Ambulatory Visit: Payer: Self-pay | Attending: Obstetrics and Gynecology

## 2021-06-28 VITALS — BP 106/64 | HR 61

## 2021-06-28 DIAGNOSIS — O365990 Maternal care for other known or suspected poor fetal growth, unspecified trimester, not applicable or unspecified: Secondary | ICD-10-CM

## 2021-06-28 DIAGNOSIS — O0933 Supervision of pregnancy with insufficient antenatal care, third trimester: Secondary | ICD-10-CM

## 2021-06-28 DIAGNOSIS — Z3A37 37 weeks gestation of pregnancy: Secondary | ICD-10-CM

## 2021-06-28 DIAGNOSIS — Z3480 Encounter for supervision of other normal pregnancy, unspecified trimester: Secondary | ICD-10-CM | POA: Insufficient documentation

## 2021-06-29 ENCOUNTER — Other Ambulatory Visit (HOSPITAL_COMMUNITY)
Admission: RE | Admit: 2021-06-29 | Discharge: 2021-06-29 | Disposition: A | Discharge status: Home / Self Care | Payer: Self-pay | Source: Ambulatory Visit | Attending: Obstetrics and Gynecology | Admitting: Obstetrics and Gynecology

## 2021-06-29 ENCOUNTER — Ambulatory Visit (INDEPENDENT_AMBULATORY_CARE_PROVIDER_SITE_OTHER): Payer: Self-pay | Admitting: Obstetrics and Gynecology

## 2021-06-29 VITALS — BP 111/60 | HR 60 | Wt 116.7 lb

## 2021-06-29 DIAGNOSIS — Z3480 Encounter for supervision of other normal pregnancy, unspecified trimester: Secondary | ICD-10-CM

## 2021-06-29 DIAGNOSIS — O0933 Supervision of pregnancy with insufficient antenatal care, third trimester: Secondary | ICD-10-CM

## 2021-06-29 DIAGNOSIS — Z3A37 37 weeks gestation of pregnancy: Secondary | ICD-10-CM

## 2021-06-29 DIAGNOSIS — N898 Other specified noninflammatory disorders of vagina: Secondary | ICD-10-CM

## 2021-06-29 NOTE — Progress Notes (Signed)
? ?  PRENATAL VISIT NOTE ? ?Subjective:  ?Carolyn Carroll is a 26 y.o. G3P2002 at [redacted]w[redacted]d being seen today for ongoing prenatal care.  She is currently monitored for the following issues for this high-risk pregnancy and has Back pain affecting pregnancy; Supervision of other normal pregnancy, antepartum; and Limited prenatal care in third trimester on their problem list. ? ?Patient doing well with no acute concerns today. She reports no complaints.  Contractions: Irritability. Vag. Bleeding: None.  Movement: Present. Denies leaking of fluid.  ? ?The following portions of the patient's history were reviewed and updated as appropriate: allergies, current medications, past family history, past medical history, past social history, past surgical history and problem list. Problem list updated. ? ?Objective:  ? ?Vitals:  ? 06/29/21 1526  ?BP: 111/60  ?Pulse: 60  ?Weight: 52.9 kg  ? ? ?Fetal Status: Fetal Heart Rate (bpm): 138 Fundal Height: 37 cm Movement: Present    ? ?General:  Alert, oriented and cooperative. Patient is in no acute distress.  ?Skin: Skin is warm and dry. No rash noted.   ?Cardiovascular: Normal heart rate noted  ?Respiratory: Normal respiratory effort, no problems with respiration noted  ?Abdomen: Soft, gravid, appropriate for gestational age.  Pain/Pressure: Absent     ?Pelvic: Cervical exam deferred        ?Extremities: Normal range of motion.  Edema: None  ?Mental Status:  Normal mood and affect. Normal behavior. Normal judgment and thought content.  ? ?Assessment and Plan:  ?Pregnancy: [id number] at [redacted]w[redacted]d ? ?1. Supervision of other normal pregnancy, antepartum ?Continue routine care ?Previous ultrasound discussed in person with Dr. Annamaria Boots  due to AFI of 6 the previous day ?- GC/Chlamydia probe amp ([place])not at [facility] ?- Culture, beta strep (group b only) ? ?2. [redacted] weeks gestation of pregnancy ? ? ?3. Vaginal lesion ?Likely shaving accident, but will check since delivery will be soon ?-  Herpes simplex virus culture ? ?4. Limited prenatal care in third trimester ?Pt has one documented visit with a provider other than MFM ? ?Term labor symptoms and general obstetric precautions including but not limited to vaginal bleeding, contractions, leaking of fluid and fetal movement were reviewed in detail with the patient. ? ?Please refer to After Visit Summary for other counseling recommendations.  ? ?No follow-ups on file. ?IOL at 38 weeks per MFM due to near oligohydramnios, please see amended MFM scan ? ?Lynnda Shields, MD ?Faculty Attending ?[facility] ?  ?

## 2021-06-30 ENCOUNTER — Telehealth (HOSPITAL_COMMUNITY): Payer: Self-pay | Admitting: *Deleted

## 2021-06-30 ENCOUNTER — Encounter (HOSPITAL_COMMUNITY): Payer: Self-pay

## 2021-06-30 LAB — GC/CHLAMYDIA PROBE AMP (~~LOC~~) NOT AT ARMC
Comment: NEGATIVE
Comment: NORMAL

## 2021-06-30 NOTE — Telephone Encounter (Signed)
Preadmission screen  

## 2021-07-01 LAB — HERPES SIMPLEX VIRUS CULTURE

## 2021-07-03 ENCOUNTER — Encounter (HOSPITAL_COMMUNITY): Payer: Self-pay | Admitting: Obstetrics & Gynecology

## 2021-07-03 ENCOUNTER — Inpatient Hospital Stay (HOSPITAL_COMMUNITY)
Admission: AD | Admit: 2021-07-03 | Discharge: 2021-07-05 | DRG: 807 | Disposition: A | Discharge status: Home / Self Care | Payer: Medicaid Other | Attending: Obstetrics and Gynecology | Admitting: Obstetrics and Gynecology

## 2021-07-03 ENCOUNTER — Other Ambulatory Visit: Payer: Self-pay

## 2021-07-03 ENCOUNTER — Inpatient Hospital Stay (EMERGENCY_DEPARTMENT_HOSPITAL)
Admission: AD | Admit: 2021-07-03 | Discharge: 2021-07-03 | Disposition: A | Discharge status: Home / Self Care | Payer: Medicaid Other | Source: Home / Self Care | Attending: Obstetrics & Gynecology | Admitting: Obstetrics & Gynecology

## 2021-07-03 ENCOUNTER — Other Ambulatory Visit: Payer: Self-pay | Admitting: Advanced Practice Midwife

## 2021-07-03 DIAGNOSIS — Z3A37 37 weeks gestation of pregnancy: Secondary | ICD-10-CM

## 2021-07-03 DIAGNOSIS — O471 False labor at or after 37 completed weeks of gestation: Secondary | ICD-10-CM | POA: Insufficient documentation

## 2021-07-03 DIAGNOSIS — O0933 Supervision of pregnancy with insufficient antenatal care, third trimester: Secondary | ICD-10-CM

## 2021-07-03 DIAGNOSIS — Z3480 Encounter for supervision of other normal pregnancy, unspecified trimester: Secondary | ICD-10-CM

## 2021-07-03 DIAGNOSIS — Z789 Other specified health status: Secondary | ICD-10-CM | POA: Diagnosis present

## 2021-07-03 DIAGNOSIS — O4190X0 Disorder of amniotic fluid and membranes, unspecified, unspecified trimester, not applicable or unspecified: Secondary | ICD-10-CM | POA: Diagnosis not present

## 2021-07-03 DIAGNOSIS — O26893 Other specified pregnancy related conditions, third trimester: Secondary | ICD-10-CM | POA: Diagnosis present

## 2021-07-03 LAB — TYPE AND SCREEN
ABO/RH(D): O POS
Antibody Screen: NEGATIVE

## 2021-07-03 LAB — CBC WITH DIFFERENTIAL/PLATELET
Abs Immature Granulocytes: 0.04 10*3/uL (ref 0.00–0.07)
Basophils Absolute: 0 10*3/uL (ref 0.0–0.1)
Basophils Relative: 0 %
Eosinophils Absolute: 0 10*3/uL (ref 0.0–0.5)
Eosinophils Relative: 0 %
HCT: 39.4 % (ref 36.0–46.0)
Hemoglobin: 12.5 g/dL (ref 12.0–15.0)
Immature Granulocytes: 0 %
Lymphocytes Relative: 9 %
Lymphs Abs: 1.1 10*3/uL (ref 0.7–4.0)
MCH: 25.5 pg — ABNORMAL LOW (ref 26.0–34.0)
MCHC: 31.7 g/dL (ref 30.0–36.0)
MCV: 80.2 fL (ref 80.0–100.0)
Monocytes Absolute: 0.5 10*3/uL (ref 0.1–1.0)
Monocytes Relative: 4 %
Neutro Abs: 10.7 10*3/uL — ABNORMAL HIGH (ref 1.7–7.7)
Neutrophils Relative %: 87 %
Platelets: 229 10*3/uL (ref 150–400)
RBC: 4.91 MIL/uL (ref 3.87–5.11)
RDW: 13.5 % (ref 11.5–15.5)
WBC: 12.3 10*3/uL — ABNORMAL HIGH (ref 4.0–10.5)
nRBC: 0 % (ref 0.0–0.2)

## 2021-07-03 LAB — CULTURE, BETA STREP (GROUP B ONLY): Strep Gp B Culture: NEGATIVE

## 2021-07-03 MED ORDER — ACETAMINOPHEN 325 MG PO TABS: 650.0000 mg | ORAL_TABLET | ORAL | Status: DC | PRN

## 2021-07-03 MED ORDER — DIBUCAINE (PERIANAL) 1 % EX OINT: 1.0000 "application " | TOPICAL_OINTMENT | CUTANEOUS | Status: DC | PRN

## 2021-07-03 MED ORDER — ACETAMINOPHEN 325 MG PO TABS
650.0000 mg | ORAL_TABLET | ORAL | Status: DC | PRN
Start: 2021-07-03 — End: 2021-07-03

## 2021-07-03 MED ORDER — IBUPROFEN 600 MG PO TABS
600.0000 mg | ORAL_TABLET | Freq: Four times a day (QID) | ORAL | Status: DC
  Administered 2021-07-03 – 2021-07-05 (×7): 600 mg via ORAL
  Filled 2021-07-03 (×7): qty 1

## 2021-07-03 MED ORDER — ZOLPIDEM TARTRATE 5 MG PO TABS: 5.0000 mg | ORAL_TABLET | Freq: Every evening | ORAL | Status: DC | PRN

## 2021-07-03 MED ORDER — LACTATED RINGERS IV SOLN: 500.0000 mL | INTRAVENOUS | Status: DC | PRN

## 2021-07-03 MED ORDER — OXYTOCIN-SODIUM CHLORIDE 30-0.9 UT/500ML-% IV SOLN
INTRAVENOUS | Status: AC
  Filled 2021-07-03: qty 500

## 2021-07-03 MED ORDER — OXYCODONE-ACETAMINOPHEN 5-325 MG PO TABS: 1.0000 | ORAL_TABLET | ORAL | Status: DC | PRN

## 2021-07-03 MED ORDER — DOCUSATE SODIUM 100 MG PO CAPS
100.0000 mg | ORAL_CAPSULE | Freq: Two times a day (BID) | ORAL | Status: DC
  Administered 2021-07-04 (×2): 100 mg via ORAL
  Filled 2021-07-03 (×2): qty 1

## 2021-07-03 MED ORDER — SENNOSIDES-DOCUSATE SODIUM 8.6-50 MG PO TABS
2.0000 | ORAL_TABLET | Freq: Every day | ORAL | Status: DC
  Administered 2021-07-04: 2 via ORAL
  Filled 2021-07-03: qty 2

## 2021-07-03 MED ORDER — ONDANSETRON HCL 4 MG/2ML IJ SOLN
4.0000 mg | Freq: Four times a day (QID) | INTRAMUSCULAR | Status: DC | PRN
Start: 2021-07-03 — End: 2021-07-03

## 2021-07-03 MED ORDER — ONDANSETRON HCL 4 MG/2ML IJ SOLN: 4.0000 mg | INTRAMUSCULAR | Status: DC | PRN

## 2021-07-03 MED ORDER — MISOPROSTOL 25 MCG QUARTER TABLET: 25.0000 ug | ORAL_TABLET | ORAL | Status: DC | PRN

## 2021-07-03 MED ORDER — BENZOCAINE-MENTHOL 20-0.5 % EX AERO: 1.0000 "application " | INHALATION_SPRAY | CUTANEOUS | Status: DC | PRN

## 2021-07-03 MED ORDER — WITCH HAZEL-GLYCERIN EX PADS: 1.0000 "application " | MEDICATED_PAD | CUTANEOUS | Status: DC | PRN

## 2021-07-03 MED ORDER — LACTATED RINGERS IV SOLN: INTRAVENOUS | Status: DC

## 2021-07-03 MED ORDER — SOD CITRATE-CITRIC ACID 500-334 MG/5ML PO SOLN: 30.0000 mL | ORAL | Status: DC | PRN

## 2021-07-03 MED ORDER — TETANUS-DIPHTH-ACELL PERTUSSIS 5-2.5-18.5 LF-MCG/0.5 IM SUSY: 0.5000 mL | PREFILLED_SYRINGE | Freq: Once | INTRAMUSCULAR | Status: DC

## 2021-07-03 MED ORDER — OXYTOCIN BOLUS FROM INFUSION
333.0000 mL | Freq: Once | INTRAVENOUS | Status: AC
  Administered 2021-07-03: 333 mL via INTRAVENOUS

## 2021-07-03 MED ORDER — ONDANSETRON HCL 4 MG PO TABS: 4.0000 mg | ORAL_TABLET | ORAL | Status: DC | PRN

## 2021-07-03 MED ORDER — OXYTOCIN-SODIUM CHLORIDE 30-0.9 UT/500ML-% IV SOLN: 1.0000 m[IU]/min | INTRAVENOUS | Status: DC

## 2021-07-03 MED ORDER — SIMETHICONE 80 MG PO CHEW: 80.0000 mg | CHEWABLE_TABLET | ORAL | Status: DC | PRN

## 2021-07-03 MED ORDER — TERBUTALINE SULFATE 1 MG/ML IJ SOLN: 0.2500 mg | Freq: Once | INTRAMUSCULAR | Status: DC | PRN

## 2021-07-03 MED ORDER — DIPHENHYDRAMINE HCL 25 MG PO CAPS
25.0000 mg | ORAL_CAPSULE | Freq: Four times a day (QID) | ORAL | Status: DC | PRN
Start: 2021-07-03 — End: 2021-07-05

## 2021-07-03 MED ORDER — LIDOCAINE HCL (PF) 1 % IJ SOLN: 30.0000 mL | INTRAMUSCULAR | Status: DC | PRN

## 2021-07-03 MED ORDER — OXYTOCIN-SODIUM CHLORIDE 30-0.9 UT/500ML-% IV SOLN
2.5000 [IU]/h | INTRAVENOUS | Status: DC
Start: 2021-07-03 — End: 2021-07-03

## 2021-07-03 MED ORDER — OXYCODONE HCL 5 MG PO TABS: 5.0000 mg | ORAL_TABLET | ORAL | Status: DC | PRN

## 2021-07-03 MED ORDER — FENTANYL CITRATE (PF) 100 MCG/2ML IJ SOLN: 50.0000 ug | INTRAMUSCULAR | Status: DC | PRN

## 2021-07-03 MED ORDER — COCONUT OIL OIL: 1.0000 "application " | TOPICAL_OIL | Status: DC | PRN

## 2021-07-03 MED ORDER — PRENATAL MULTIVITAMIN CH
1.0000 | ORAL_TABLET | Freq: Every day | ORAL | Status: DC
  Administered 2021-07-04: 1 via ORAL
  Filled 2021-07-03: qty 1

## 2021-07-03 NOTE — Plan of Care (Signed)
completed

## 2021-07-03 NOTE — MAU Provider Note (Addendum)
Ms. Carolyn Carroll Carolyn Carroll is a 26 y.o. [phone] female at [redacted]w[redacted]d weeks gestation presenting to MAU with complaints of contractions and bloody show. MAU provider was requested by RN to verify presentation. ? ?NST - FHR: 145 bpm / moderate variability / accels present / decels absent / TOCO: regular every 3 mins  ? ?Patient informed that the ultrasound is considered a limited OB ultrasound and is not intended to be a complete ultrasound exam.  Patient also informed that the ultrasound is not being completed with the intent of assessing for fetal or placental anomalies or any pelvic abnormalities.  Explained that the purpose of today?s ultrasound is to assess for presentation.  Baby was found to be in a cephalic presentation. Patient acknowledges the purpose of the exam and the limitations of the study. ? ?Reassessment by RN @ 1203 -- cervix unchanged ? ?Raelyn Mora, CNM  ?07/03/2021 10:50 AM  ? ?

## 2021-07-03 NOTE — Discharge Summary (Signed)
? ?  Postpartum Discharge Summary ? ?[employer] Spanish video interpreter Merrie Roof, [phone]) used for entirety of encounter.  ?   ?Patient Name: Carolyn Carroll ?DOB: 09-Apr-1995 ?MRN: [id number] ? ?Date of admission: 07/03/2021 ?Delivery date:07/03/2021  ?Delivering provider: Earlie Lou A  ?Date of discharge: 07/05/2021 ? ?Admitting diagnosis: Amniotic fluid abnormal [O41.90X0] ?Intrauterine pregnancy: [redacted]w[redacted]d    ?Secondary diagnosis:  Principal Problem: ?  Vaginal delivery ?Active Problems: ?  Supervision of other normal pregnancy, antepartum ?  Limited prenatal care in third trimester ?  Language barrier ? ?Additional problems: None    ?Discharge diagnosis: Term Pregnancy Delivered                                              ?Post partum procedures: None ?Augmentation:  None ?Complications: None ? ?Hospital course: Onset of Labor With Vaginal Delivery      ?26y.o. yo G3P2002 at 332w6das admitted in Active Labor on 07/03/2021. Patient had an uncomplicated labor course as follows:  ?Membrane Rupture Time/Date: 5:20 PM ,07/03/2021   ?Delivery Method:Vaginal, Spontaneous  ?Episiotomy: None  ?Lacerations:  None  ?Patient had an uncomplicated postpartum course.  She is ambulating, tolerating a regular diet, passing flatus, and urinating well.  Her pain and bleeding are controlled.  She is breast and formula feeding well.  Patient is discharged home in stable condition on 07/05/21. ? ?Newborn Data: ?Birth date:07/03/2021  ?Birth time:5:21 PM  ?Gender:Female  ?Living status:Living  ?Apgars:9 ,9  ?Weight:2690 g (5lb 14.9oz) ? ?Magnesium Sulfate received: No ?BMZ received: No ?Rhophylac: N/A ?MMR: N/A ?T-DaP: Given prenatally ?Flu: Yes ?Transfusion: No ? ?Physical exam  ?Vitals:  ? 07/04/21 0855 07/04/21 1705 07/04/21 2100 07/05/21 0521  ?BP: (!) 105/54 (!) 111/52 111/68 114/78  ?Pulse: 82 61 66 65  ?Resp: 18 18 18 18   ?Temp: 98.2 ?F (36.8 ?C) 98.2 ?F (36.8 ?C) 98.4 ?F (36.9 ?C) 97.8 ?F (36.6 ?C)  ?TempSrc: Axillary  Oral  Oral  ?SpO2: 98% 99% 100% 100%  ? ?General: alert, cooperative, and no distress ?Lochia: appropriate ?Uterine Fundus: firm and below umbilicus  ?DVT Evaluation: no LE edema or calf tenderness to palpation  ? ?Labs: ?Lab Results  ?Component Value Date  ? WBC 12.3 (H) 07/03/2021  ? HGB 12.5 07/03/2021  ? HCT 39.4 07/03/2021  ? MCV 80.2 07/03/2021  ? PLT 229 07/03/2021  ? ? ?  Latest Ref Rng & Units 03/09/2021  ?  2:23 AM  ?CMP  ?Glucose 70 - 99 mg/dL 98    ?BUN 6 - 20 mg/dL 9    ?Creatinine 0.44 - 1.00 mg/dL 0.47    ?Sodium 135 - 145 mmol/L 134    ?Potassium 3.5 - 5.1 mmol/L 3.5    ?Chloride 98 - 111 mmol/L 104    ?CO2 22 - 32 mmol/L 22    ?Calcium 8.9 - 10.3 mg/dL 8.5    ?Total Protein 6.5 - 8.1 g/dL 6.0    ?Total Bilirubin 0.3 - 1.2 mg/dL 0.3    ?Alkaline Phos 38 - 126 U/L 54    ?AST 15 - 41 U/L 14    ?ALT 0 - 44 U/L 17    ? ?Edinburgh Score: ? ?  07/04/2021  ? 11:03 AM  ?EdFlavia Shipperostnatal Depression Scale Screening Tool  ?I have been able to laugh and see the funny side of things.  0  ?I have looked forward with enjoyment to things. 0  ?I have blamed myself unnecessarily when things went wrong. 0  ?I have been anxious or worried for no good reason. 0  ?I have felt scared or panicky for no good reason. 0  ?Things have been getting on top of me. 0  ?I have been so unhappy that I have had difficulty sleeping. 0  ?I have felt sad or miserable. 0  ?I have been so unhappy that I have been crying. 0  ?The thought of harming myself has occurred to me. 0  ?Edinburgh Postnatal Depression Scale Total 0  ? ? ? ?After visit meds:  ?Allergies as of 07/05/2021   ?No Known Allergies ?  ? ?  ?Medication List  ?  ? ?STOP taking these medications   ? ?cyclobenzaprine 5 MG tablet ?Commonly known as: FLEXERIL ?  ?omeprazole 20 MG tablet ?Commonly known as: PriLOSEC OTC ?  ? ?  ? ?TAKE these medications   ? ?acetaminophen 500 MG tablet ?Commonly known as: TYLENOL ?Take 2 tablets (1,000 mg total) by mouth every 8 (eight) hours as needed  (pain). ?  ?ibuprofen 600 MG tablet ?Commonly known as: ADVIL ?Take 1 tablet (600 mg total) by mouth every 6 (six) hours as needed (pain). ?  ?prenatal multivitamin Tabs tablet ?Take 1 tablet by mouth daily at 12 noon. ?  ? ?  ? ? ? ?Discharge home in stable condition ?Infant Feeding: Bottle and Breast ?Infant Disposition: home with mother ?Discharge instruction: per After Visit Summary and Postpartum booklet. ?Activity: Advance as tolerated. Pelvic rest for 6 weeks.  ?Diet: routine diet ?Future Appointments: ?Future Appointments  ?Date Time Provider [place]  ?08/11/2021  8:55 AM Woodroe Mode, MD [facility]  ? ? ?Follow up Visit: ? ?Myrtis Ser, CNM  P [facility] Pool ?Please schedule this patient for Postpartum visit in: 6 weeks with the following provider: Any provider  ?In-Person  ?For C/S patients schedule nurse incision check in weeks 2 weeks: No  ?Low risk pregnancy complicated by: Only 2 prenatal visits  ?Delivery mode:  SVD  ?Anticipated Birth Control:  Other/unsure  ?PP Procedures needed: Pap  ?Schedule Integrated BH visit: No  ? ?07/05/2021 ?Genia Del, MD ? ? ? ?

## 2021-07-03 NOTE — MAU Note (Signed)
Carolyn Carroll is a 26 y.o. at [redacted]w[redacted]d here in MAU reporting: abd pain, no bleeding or LOF.  Reports +FM.  Spanish translator is coming ?Pain score: mucho ?Vitals:  ? 07/03/21 1032  ?BP: 108/60  ?Resp: 16  ?Temp: 97.9 ?F (36.6 ?C)  ?SpO2: 100%  ?   ?FHT:135 ?Lab orders placed from triage:  none ?

## 2021-07-03 NOTE — H&P (Addendum)
Carolyn Carroll is a 26 y.o. [phone] female at [redacted]w[redacted]d by 29wk ultrasound presenting in SOL.   ?Reports active fetal movement, contractions: regular, vaginal bleeding: none, membranes: intact.  ?Initiated prenatal care at [facility] at 30 wks.   ?Most recent u/s 06/28/21.  ? ?This pregnancy complicated by: ?Insufficient prenatal care (30wk and 37wk visits) ? ?Prenatal History/Complications:  ?Low amniotic fluid dx at 37.1wk (not oligo) ? ?Past Medical History: ?Past Medical History:  ?Diagnosis Date  ? Medical history non-contributory   ? ? ?Past Surgical History: ?Past Surgical History:  ?Procedure Laterality Date  ? NO PAST SURGERIES    ? ? ?Obstetrical History: ?OB History   ? ? Gravida  ?3  ? Para  ?2  ? Term  ?2  ? Preterm  ?   ? AB  ?   ? Living  ?2  ?  ? ? SAB  ?   ? IAB  ?   ? Ectopic  ?   ? Multiple  ?   ? Live Births  ?2  ?   ?  ?  ? ? ?Social History: ?Social History  ? ?Socioeconomic History  ? Marital status: Significant Other  ?  Spouse name: Not on file  ? Number of children: Not on file  ? Years of education: Not on file  ? Highest education level: Not on file  ?Occupational History  ? Not on file  ?Tobacco Use  ? Smoking status: Never  ? Smokeless tobacco: Never  ?Vaping Use  ? Vaping Use: Never used  ?Substance and Sexual Activity  ? Alcohol use: Not Currently  ? Drug use: Never  ? Sexual activity: Yes  ?  Birth control/protection: None  ?Other Topics Concern  ? Not on file  ?Social History Narrative  ? Not on file  ? ?Social Determinants of Health  ? ?Financial Resource Strain: Not on file  ?Food Insecurity: Food Insecurity Present  ? Worried About Programme researcher, broadcasting/film/video in the Last Year: Sometimes true  ? Ran Out of Food in the Last Year: Sometimes true  ?Transportation Needs: No Transportation Needs  ? Lack of Transportation (Medical): No  ? Lack of Transportation (Non-Medical): No  ?Physical Activity: Not on file  ?Stress: Not on file  ?Social Connections: Not on file  ? ? ?Family  History: ?Family History  ?Problem Relation Age of Onset  ? Diabetes Neg Hx   ? Hypertension Neg Hx   ? ? ?Allergies: ?No Known Allergies ? ?Medications Prior to Admission  ?Medication Sig Dispense Refill Last Dose  ? Prenatal Vit-Fe Fumarate-FA (PRENATAL MULTIVITAMIN) TABS tablet Take 1 tablet by mouth daily at 12 noon.   07/03/2021  ? cyclobenzaprine (FLEXERIL) 5 MG tablet Take 1 tablet (5 mg total) by mouth every 8 (eight) hours as needed for muscle spasms. (Patient not taking: Reported on 04/19/2021) 20 tablet 0   ? omeprazole (PRILOSEC OTC) 20 MG tablet Take 1 tablet (20 mg total) by mouth daily. 30 tablet 2   ? ? ?Review of Systems  ?Pertinent pos/neg as indicated in HPI ? ?Last menstrual period 11/06/2020. ?General appearance: alert and cooperative ?Lungs: clear to auscultation bilaterally ?Heart: regular rate and rhythm ?Abdomen: gravid, soft, non-tender, EFW 31% 06/28/21 ?Extremities: no edema ?DTR's 2+ ? ?Fetal monitoring: FHR: 145 bpm, variability: moderate,  Accelerations: ?Abscent,  decelerations:  Present  early, variable ?Uterine activity: 1-3 ?Station: Plus 2 ?Exam by:: cnm student ?Presentation: cephalic ? ? ?Prenatal labs: ?ABO, Rh: --/--/PENDING (04/30 1720) ?  Antibody: PENDING (04/30 1720) ?Rubella: 6.50 (02/14 1059) ?RPR: Non Reactive (02/14 1059)  ?HBsAg: Negative (02/14 1059)  ?HIV: Non Reactive (02/14 1059)  ?GBS: Negative/-- (04/26 1621)  ?2hr GTT: N/A ?A1c: 5.4 (02/14) ? ?Prenatal Transfer Tool  ?Maternal Diabetes: No ?Genetic Screening: Low risk, female ?Maternal Ultrasounds/Referrals: Other: Low amniotic fluid ?Fetal Ultrasounds or other Referrals:  None ?Maternal Substance Abuse:  No ?Significant Maternal Medications:  None ?Significant Maternal Lab Results: Group B Strep negative ? ?Results for orders placed or performed during the hospital encounter of 07/03/21 (from the past 24 hour(s))  ?Type and screen [facility]  ? Collection Time: 07/03/21  5:20 PM  ?Result Value Ref  Range  ? ABO/RH(D) PENDING   ? Antibody Screen PENDING   ? Sample Expiration    ?  07/06/2021,2359 ?Performed at [facility], [address], [place], [place] [address] ?  ?  ? ?Assessment: ? [redacted]w[redacted]d SIUP ? [id number] ? SOL ? Cat 2 FHR ? GBS Negative/-- (04/26 1621) ? ?Plan: ? Admit to L&D ? IV pain meds/epidural prn active labor ? Expectant Management ? Anticipate SVD  ? Plans to Breast/Bottle feed ? Contraception: Undecided ? Circumcision: No ? ?Karl Pock, SNM ?07/03/2021, 5:41 PM  ? ?CNM attestation: ? ?I have seen and examined this patient; I agree with above documentation in the student midwife's note.  ? ?Carolyn Carroll is a 26 y.o. [phone] here for SOL, active labor/transition ? ?PE: ?BP 123/74   Pulse 64   Resp 15   LMP 11/06/2020  ?Gen: breathing w ctx ?Resp: normal effort, no distress ?Abd: gravid ? ?ROS, labs, PMH reviewed ? ?Plan: ?Admit to Labor and Delivery ?Begin pushing w urge ?Anticipate vag del ? ?[name] CNM ?07/03/2021, 6:15 PM  ?

## 2021-07-03 NOTE — Progress Notes (Signed)
1640 MAU staff called to waiting room by registration staff stating that patient was screaming in bathroom. Patient looked very uncomfortable. Patient brought to 130. Interpeter raquel called. Patient stated that the pain is 10/10 and she feels a lot of pressure and wants to push ? FHT 135.  ?VE 9/90/-1  ? R. Arita Miss called to room..  L&D called patient transferred to L&D accompanied by midwife. ?

## 2021-07-04 LAB — RPR: RPR Ser Ql: NONREACTIVE

## 2021-07-04 NOTE — Progress Notes (Signed)
FOB understands english well. I told him if he does not understand something to let me know and I will use the stratus. Patient has no major complaints at this time.  ?

## 2021-07-04 NOTE — Progress Notes (Signed)
POSTPARTUM PROGRESS NOTE ? ?Post Partum Day 1 ? ?Subjective: ? ?Carolyn Carroll Delyn Schellinger is a 26 y.o. [phone] s/p vaginal delivery at [redacted]w[redacted]d.  No acute events overnight.  Pt denies problems with ambulating, voiding or po intake.  She denies nausea or vomiting.  Pain is well controlled.  She has had flatus. She has not had bowel movement.  Lochia Minimal.  ? ?Objective: ?Blood pressure (!) 92/58, pulse 65, temperature 98.7 ?F (37.1 ?C), temperature source Oral, resp. rate 16, last menstrual period 11/06/2020, SpO2 100 %, unknown if currently breastfeeding. ? ?Physical Exam:  ?General: alert, cooperative and no distress ?Chest: no respiratory distress ?Heart:regular rate, distal pulses intact ?Abdomen: soft, nontender,  ?Uterine Fundus: firm, appropriately tender ?DVT Evaluation: No calf swelling or tenderness ?Extremities: no edema ?Skin: warm, dry ? ?Recent Labs  ?  07/03/21 ?[id number]  ?HGB 12.5  ?HCT 39.4  ? ?Results for orders placed or performed during the hospital encounter of 07/03/21 (from the past 24 hour(s))  ?CBC with Differential/Platelet     Status: Abnormal  ? Collection Time: 07/03/21  5:20 PM  ?Result Value Ref Range  ? WBC 12.3 (H) 4.0 - 10.5 K/uL  ? RBC 4.91 3.87 - 5.11 MIL/uL  ? Hemoglobin 12.5 12.0 - 15.0 g/dL  ? HCT 39.4 36.0 - 46.0 %  ? MCV 80.2 80.0 - 100.0 fL  ? MCH 25.5 (L) 26.0 - 34.0 pg  ? MCHC 31.7 30.0 - 36.0 g/dL  ? RDW 13.5 11.5 - 15.5 %  ? Platelets 229 150 - 400 K/uL  ? nRBC 0.0 0.0 - 0.2 %  ? Neutrophils Relative % 87 %  ? Neutro Abs 10.7 (H) 1.7 - 7.7 K/uL  ? Lymphocytes Relative 9 %  ? Lymphs Abs 1.1 0.7 - 4.0 K/uL  ? Monocytes Relative 4 %  ? Monocytes Absolute 0.5 0.1 - 1.0 K/uL  ? Eosinophils Relative 0 %  ? Eosinophils Absolute 0.0 0.0 - 0.5 K/uL  ? Basophils Relative 0 %  ? Basophils Absolute 0.0 0.0 - 0.1 K/uL  ? Immature Granulocytes 0 %  ? Abs Immature Granulocytes 0.04 0.00 - 0.07 K/uL  ?Type and screen [place]     Status: None  ? Collection Time:  07/03/21  5:20 PM  ?Result Value Ref Range  ? ABO/RH(D) O POS   ? Antibody Screen NEG   ? Sample Expiration    ?  07/06/2021,2359 ?Performed at [facility], [place] [address] [place], [place] [address] ?  ? ?Assessment/Plan: ?Dwayne Lierman is a 26 y.o. [phone] s/p vaginal delivery at [redacted]w[redacted]d  ? ?PPD#1 - Doing well ?Contraception: undecided ?Feeding: breast and bottle ?Dispo: Plan for discharge on 5/2. ? ? LOS: 1 day  ? ?Fontaine No, [employer] ?07/04/2021, 6:01 AM   ? ? ?

## 2021-07-04 NOTE — Social Work (Signed)
CSW acknowledged consult and completed a clinical assessment.  There are no barriers to d/c. ? ?Clinical assessment notes will be entered at a later time. ? ?Carolyn Carroll, MSW, LCSW ?[facility]  ?Clinical Social Worker  ?[phone] ?07/04/2021  4:57 PM  ?

## 2021-07-04 NOTE — Lactation Note (Signed)
This note was copied from a baby's chart. ?Lactation Consultation Note ? ?Patient Name: Carolyn Carroll ?Today's Date: 07/04/2021 ?Reason for consult: Initial assessment;Early term 37-38.6wks;1st time breastfeeding;Infant < 6lbs ?Age:27 hours ? ? ?P3 mother whose infant is now 34 hours old.  This is an early term infant at 37+6 weeks.  Mother did not breast feed her first two children.  Her current feeding preference is breast/formula. ? ?In house Spanish interpreter not available at this time.  Ipad used for interpretation Reyne Dumas [phone]). ? ?Mother reported feeding formula recently.  Reviewed breast feeding basics with parents.  Encouraged breast massage and hand expression to help ensure a good milk supply.  Demonstrated finger/spoon feeding.  Offered to return as needed for latch assistance. ? ?Mother is planning on accepting the formula package from [facility]: suggested she may want to contact the [facility] office today for follow up.  Father present and speaks some [place].   ? ? ?Maternal Data ?Has patient been taught Hand Expression?: Yes ?Does the patient have breastfeeding experience prior to this delivery?: No ? ?Feeding ?Mother's Current Feeding Choice: Breast Milk and Formula ? ?LATCH Score ?  ? ?  ? ?  ? ?  ? ?  ? ?  ? ? ?Lactation Tools Discussed/Used ?  ? ?Interventions ?Interventions: Breast feeding basics reviewed;Skin to skin;Hand express;Education;LC Services brochure ? ?Discharge ?WIC Program: Yes ? ?Consult Status ?Consult Status: Follow-up ?Date: 07/05/21 ?Follow-up type: In-patient ? ? ? ?Carolyn Carroll R Carolyn Carroll ?07/04/2021, 9:03 AM ? ? ? ?

## 2021-07-05 MED ORDER — ACETAMINOPHEN 500 MG PO TABS: 1000.0000 mg | ORAL_TABLET | Freq: Three times a day (TID) | ORAL | 0 refills | Status: DC | PRN

## 2021-07-05 MED ORDER — IBUPROFEN 600 MG PO TABS: 600.0000 mg | ORAL_TABLET | Freq: Four times a day (QID) | ORAL | 0 refills | Status: DC | PRN

## 2021-07-05 NOTE — Progress Notes (Signed)
Teaching done with [facility] interpreter. ?

## 2021-07-05 NOTE — Clinical Social Work Maternal (Addendum)
?Patient Details  ?Name: Carolyn Carroll ?MRN: [id number] ?Date of Birth: November 07, 1995 ? ?Date:  07/05/2021 ? ?Clinical Social Worker Initiating Note:  Carolyn Greathouse, LCSW Date/Time: Initiated:  07/04/21/1230    ? ?Child's Name:  Carolyn Carroll.  ? ?Biological Parents:  Mother: Carolyn Carroll 07-06-2021, Father: Carolyn Carroll 01-20-1997 ? ?Need for Interpreter:  None  ? ?Reason for Referral:  Late or No Prenatal Care    ? ?Address:  [address] ?[place] [address]  ?  ?Phone number:  [phone] (home)    ? ?Additional phone number:  ? ?Household Members/Support Persons (HM/SP):   Household Member/Support Person 1, Household Member/Support Person 2, Household Member/Support Person 3 ? ? ?HM/SP Name Relationship DOB or Age  ?HM/SP -1 Carolyn Carroll Mother in [place] 07/09/1995  ?HM/SP -2 Carolyn Carroll Son 04-13-2017  ?HM/SP -3 Carolyn Carroll Significant Other 01-20-1997  ?HM/SP -4        ?HM/SP -5        ?HM/SP -6        ?HM/SP -7        ?HM/SP -8        ? ? ?Natural Supports (not living in the home):     ? ?Professional Supports: None  ? ?Employment: Unemployed  ? ?Type of Work:    ? ?Education:  9 to 11 years  ? ?Homebound arranged: No ? ?Financial Resources:  Other (Comment) (Medicaid Potential)  ? ?Other Resources:  WIC  ? ?Cultural/Religious Considerations Which May Impact Care:   ? ?Strengths:  Ability to meet basic needs  , Home prepared for child    ? ?Psychotropic Medications:        ? ?Pediatrician:    [place]yn Carroll area ? ?Pediatrician List:  ? ?    ?[place]    ?[facility]    ?[facility]    ?[facility]    ?[facility]    ? ? ?Pediatrician Fax Number:   ? ?Risk Factors/Current Problems:  None  ? ?Cognitive State:  Able to Concentrate  , Alert  , Insightful  , Linear Thinking    ? ?Mood/Affect:  Calm  , Interested    ? ?CSW Assessment:  CSW received consult for hx late/limited PNC (2 visits) CSW met with MOB to offer support and complete  assessment.  ? ?MOB and FOB requested CSW use an interpreter. Interpreter Minus Liberty [phone] used during the encounter. ? ?CSW met with MOB at bedside and introduced CSW role. CSW observed MOB up eating lunch, infant asleep in bassinet and FOB present at bedside. CSW offered MOB privacy. MOB wanted FOB to be present during the visit. MOB confirmed that the demographic information on hospital file is correct. MOB reported that she lives with FOB, her mother-in-law and boyfriend. MOB reported that her eight-year-old son lives in [place] with her maternal grandmother. MOB identified her MIL and boyfriend as supports. MOB reported that she is unemployed, and FOB works for a [employer]. MOB reported that she receives [facility] and will call to update the agency about the birth.  ? ?CSW inquired about MOB late/limited prenatal care. MOB reported, " I did not want to go. I was really sleepy and did not like to wake up." CSW encouraged MOB for future appointments to try and make them since the reason for appointments is to ensure that she and the infant are healthy. MOB reported understanding. CSW informed MOB about the hospital drug screen policy. MOB made aware that CSW  will follow the infant's UDS/CDS and make a report if warranted. MOB denied substance use during the pregnancy. MOB denied CPS history.  ? ?CSW inquired if MOB has mental health history. MOB reported no mental health history. CSW discussed PPD. CSW provided education regarding the baby blues period vs. perinatal mood disorders, discussed treatment and gave resources for mental health follow up if concerns arise.  CSW recommended MOB complete a self-evaluation during the postpartum time period using the New Mom Checklist from Postpartum Progress and encouraged MOB to contact a medical professional if symptoms are noted at any time.   ? ?MOB reported that she has essential items for the infant including a bassinet where the infant will sleep. CSW provided review  of Sudden Infant Death Syndrome (SIDS) precautions. MOB to decide on a pediatrician for the infant. MOB reported that she will have transportation to the appointments. CSW assessed MOB for additional need. MOB reported no further need. CSW assessed MOB for safety. MOB denied thoughts of harm to self and others.  ? ?CSW identifies no further need for intervention and no barriers to discharge at this time. ? ? ?CSW Plan/Description:  Sudden Infant Death Syndrome (SIDS) Education, CSW Will Continue to Monitor Umbilical Cord Tissue Drug Screen Results and Make Report if Warranted, [place], Perinatal Mood and Anxiety Disorder (PMADs) Education, No Further Intervention Required/No Barriers to Discharge  ? ? ?Lia Hopping, LCSW ?07/05/2021, 9:23 AM ? ?

## 2021-07-05 NOTE — Lactation Note (Signed)
This note was copied from a baby's chart. ?Lactation Consultation Note ? ?Patient Name: Carolyn Carroll ?Today's Date: 07/05/2021 ?Reason for consult: Follow-up assessment;Early term 37-38.6wks;1st time breastfeeding ?Age:27 hours ? ? ?Per mother whose infant is now 64 hours old.  Mother's current feeding preference is breast/formula.  This is her first time breast feeding. ? ?Spanish interpreter, Darin Engels [phone]) used for interpretation. ? ?Mother had no questions/concerns related to breast feeding.  She has been latching and denies pain with feedings.  Mother does not wish to have any assistance with breast feeding. She does not latch with every feed.  She has been supplementing with large volumes of formula.  Reviewed the supplementation guideline sheet as discussed yesterday.  Parents verbalized understanding. ? ?WIC representative called while I was present and mother is established and will receive formula from the [facility] department.  Parents have our OP phone number for any concerns after discharge. ? ? ?Maternal Data ?  ? ?Feeding ?Mother's Current Feeding Choice: Breast Milk and Formula ? ?LATCH Score ?  ? ?  ? ?  ? ?  ? ?  ? ?  ? ? ?Lactation Tools Discussed/Used ?  ? ?Interventions ?Interventions: Education ? ?Discharge ?Discharge Education: Engorgement and breast care ?WIC Program: Yes ? ?Consult Status ?Consult Status: Complete ?Date: 07/05/21 ?Follow-up type: Call as needed ? ? ? ?Chayson Charters R Kamiah Fite ?07/05/2021, 8:58 AM ? ? ? ?

## 2021-07-05 NOTE — Progress Notes (Signed)
No complaints at this time.

## 2021-07-06 ENCOUNTER — Inpatient Hospital Stay (HOSPITAL_COMMUNITY): Admission: AD | Admit: 2021-07-06 | Payer: Self-pay | Source: Home / Self Care | Admitting: Obstetrics and Gynecology

## 2021-07-06 ENCOUNTER — Inpatient Hospital Stay (HOSPITAL_COMMUNITY): Payer: Self-pay

## 2021-07-07 ENCOUNTER — Encounter: Payer: Self-pay | Admitting: Student

## 2021-07-11 ENCOUNTER — Telehealth (HOSPITAL_COMMUNITY): Payer: Self-pay | Admitting: *Deleted

## 2021-07-11 NOTE — Telephone Encounter (Signed)
Attempted hospital discharge follow-up call with [employer] interpreter, Loni Muse [phone]. No answer received. Deforest Hoyles, RN, 07/11/21, 1655 ?

## 2021-07-14 ENCOUNTER — Encounter: Payer: Self-pay | Admitting: Family Medicine

## 2021-08-11 ENCOUNTER — Ambulatory Visit: Payer: Self-pay | Admitting: Obstetrics & Gynecology

## 2021-08-31 NOTE — Progress Notes (Deleted)
    Post Partum Visit Note  Carolyn Carroll is a 26 y.o. [phone] female who presents for a postpartum visit. She is 8 weeks postpartum following a normal spontaneous vaginal delivery.  I have fully reviewed the prenatal and intrapartum course. The delivery was at 37.6 gestational weeks.  Anesthesia: none. Postpartum course has been ***. Baby is doing well***. Baby is feeding by {breastmilk/bottle:69}. Bleeding {vag bleed:12292}. Bowel function is {normal:32111}. Bladder function is {normal:32111}. Patient {is/is not:9024} sexually active. Contraception method is none. Postpartum depression screening: {gen negative/positive:315881}.   The pregnancy intention screening data noted above was reviewed. Potential methods of contraception were discussed. The patient elected to proceed with No data recorded.    Health Maintenance Due  Topic Date Due   TETANUS/TDAP  Never done   PAP-Cervical Cytology Screening  Never done   PAP SMEAR-Modifier  Never done    {Common ambulatory SmartLinks:19316}  Review of Systems {ros; complete:30496}  Objective:  LMP 11/06/2020    General:  {gen appearance:16600}   Breasts:  {desc; normal/abnormal/not indicated:14647}  Lungs: {lung exam:16931}  Heart:  {heart exam:5510}  Abdomen: {abdomen exam:16834}   Wound {Wound assessment:11097}  GU exam:  {desc; normal/abnormal/not indicated:14647}       Assessment:    There are no diagnoses linked to this encounter.  *** postpartum exam.   Plan:   Essential components of care per ACOG recommendations:  1.  Mood and well being: Patient with {gen negative/positive:315881} depression screening today. Reviewed local resources for support.  - Patient tobacco use? {tobacco use:25506}  - hx of drug use? {yes/no:25505}    2. Infant care and feeding:  -Patient currently breastmilk feeding? {yes/no:25502}  -Social determinants of health (SDOH) reviewed in EPIC. No concerns***The following needs were  identified***  3. Sexuality, contraception and birth spacing - Patient {DOES_DOES KDT:26712} want a pregnancy in the next year.  Desired family size is {NUMBER 1-10:22536} children.  - Reviewed reproductive life planning. Reviewed contraceptive methods based on pt preferences and effectiveness.  Patient desired {Upstream End Methods:24109} today.   - Discussed birth spacing of 18 months  4. Sleep and fatigue -Encouraged family/partner/community support of 4 hrs of uninterrupted sleep to help with mood and fatigue  5. Physical Recovery  - Discussed patients delivery and complications. She describes her labor as {description:25511} - Patient had a {CHL AMB DELIVERY:[phone]}. Patient had a {laceration:25518} laceration. Perineal healing reviewed. Patient expressed understanding - Patient has urinary incontinence? {yes/no:25515} - Patient {ACTION; IS/IS WPY:[id number]} safe to resume physical and sexual activity  6.  Health Maintenance - HM due items addressed {Yes or If no, why not?:20788} - Last pap smear No results found for: "DIAGPAP" Pap smear {done:10129} at today's visit.  -Breast Cancer screening indicated? {indicated:25516}  7. Chronic Disease/Pregnancy Condition follow up: {Follow up:25499}  - PCP follow up  Guy Begin, CMA Center for [employer], [facility]

## 2021-09-01 ENCOUNTER — Ambulatory Visit: Payer: Self-pay | Admitting: Student

## 2022-02-21 ENCOUNTER — Ambulatory Visit (INDEPENDENT_AMBULATORY_CARE_PROVIDER_SITE_OTHER): Payer: Self-pay

## 2022-02-21 DIAGNOSIS — Z3201 Encounter for pregnancy test, result positive: Secondary | ICD-10-CM

## 2022-02-21 DIAGNOSIS — Z3200 Encounter for pregnancy test, result unknown: Secondary | ICD-10-CM

## 2022-02-21 LAB — POCT PREGNANCY, URINE: Preg Test, Ur: POSITIVE — AB

## 2022-02-21 NOTE — Progress Notes (Unsigned)
Possible Pregnancy  Here today to leave urine specimen for pregnancy confirmation. UPT in office today is positive. Called pt with interpreter Claudia; no answer. MyChart message sent.  Marjo Bicker, RN 02/21/2022  2:58 PM

## 2022-03-06 NOTE — L&D Delivery Note (Signed)
OB/GYN Faculty Practice Delivery Note  Carolyn Carroll is a 27 y.o. [phone] s/p vag delivery at [redacted]w[redacted]d. She was admitted for SOL.   ROM: 0h 58m with clear fluid GBS Status: neg Maximum Maternal Temperature: 98.3  Labor Progress: Carolyn Carroll was admitted in spont labor and progressed to vaginal delivery without complication.  Delivery Date/Time: June 23rd, 2024 at 0618 Delivery: Called to room and patient was complete and beginning to push. Head delivered LOA. Nuchal cord present x 1, reduced prior to delivery. Shoulder and body delivered in usual fashion. Infant with spontaneous cry, placed on mother's abdomen, dried and stimulated. Cord clamped x 2 after 1-minute delay, and cut by FOB. Cord blood drawn. Placenta delivered spontaneously with gentle cord traction. Fundus firm with massage and Pitocin. Labia, perineum, vagina, and cervix inspected and found to be intact.   Placenta: spont, intact; to L&D Complications: none Lacerations: none EBL: 87cc Analgesia: none  Postpartum Planning [x]  message to sent to schedule follow-up   Infant: boy  APGARs 9/9  2330g (5lb 2.2oz)  [name], CNM  08/27/2022 6:45 AM

## 2022-03-23 IMAGING — DX DG CHEST 2V
2 series · 2 of 2 positions shown · non-contrast
Comparison: None.

CLINICAL DATA: Syncope.  Orthostatic changes.

EXAM:
CHEST - 2 VIEW

[chest pa]
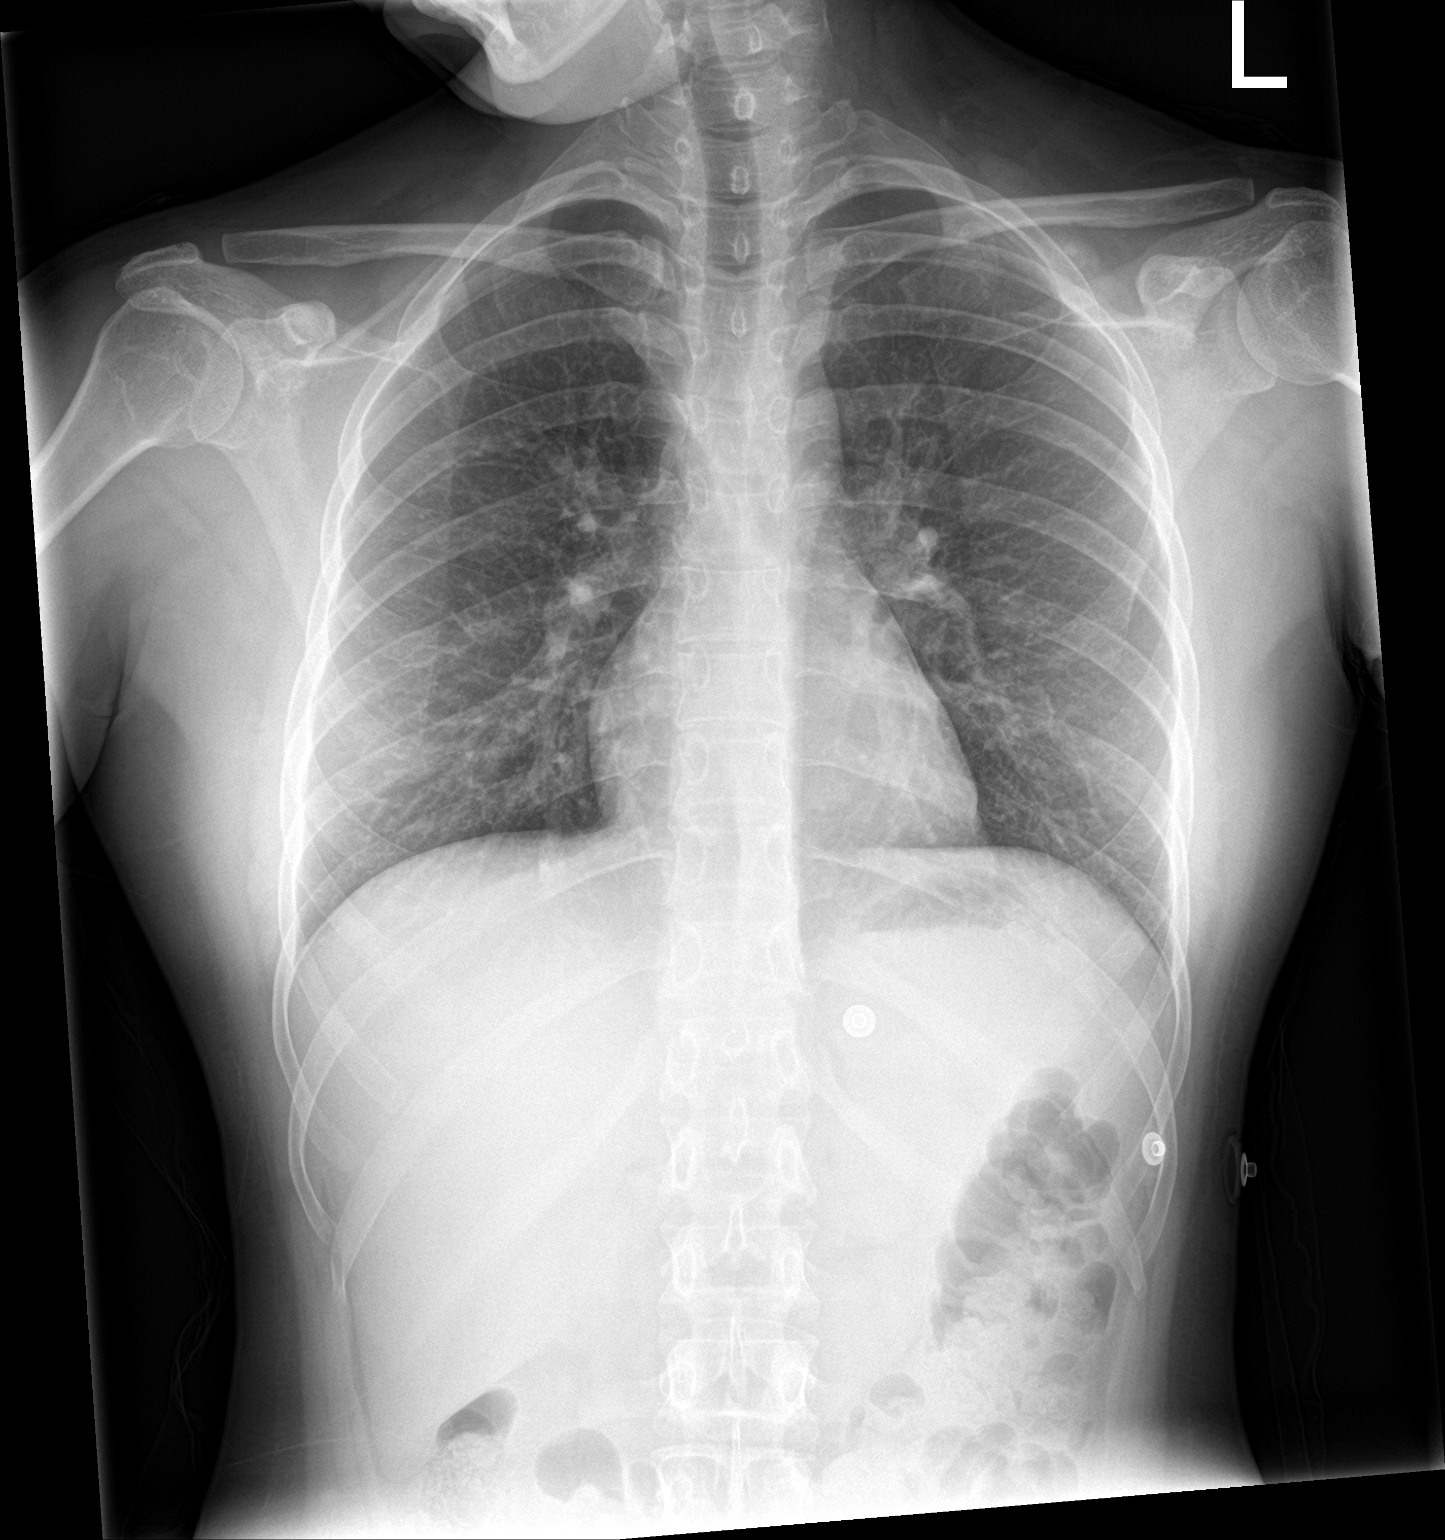

[chest lat]
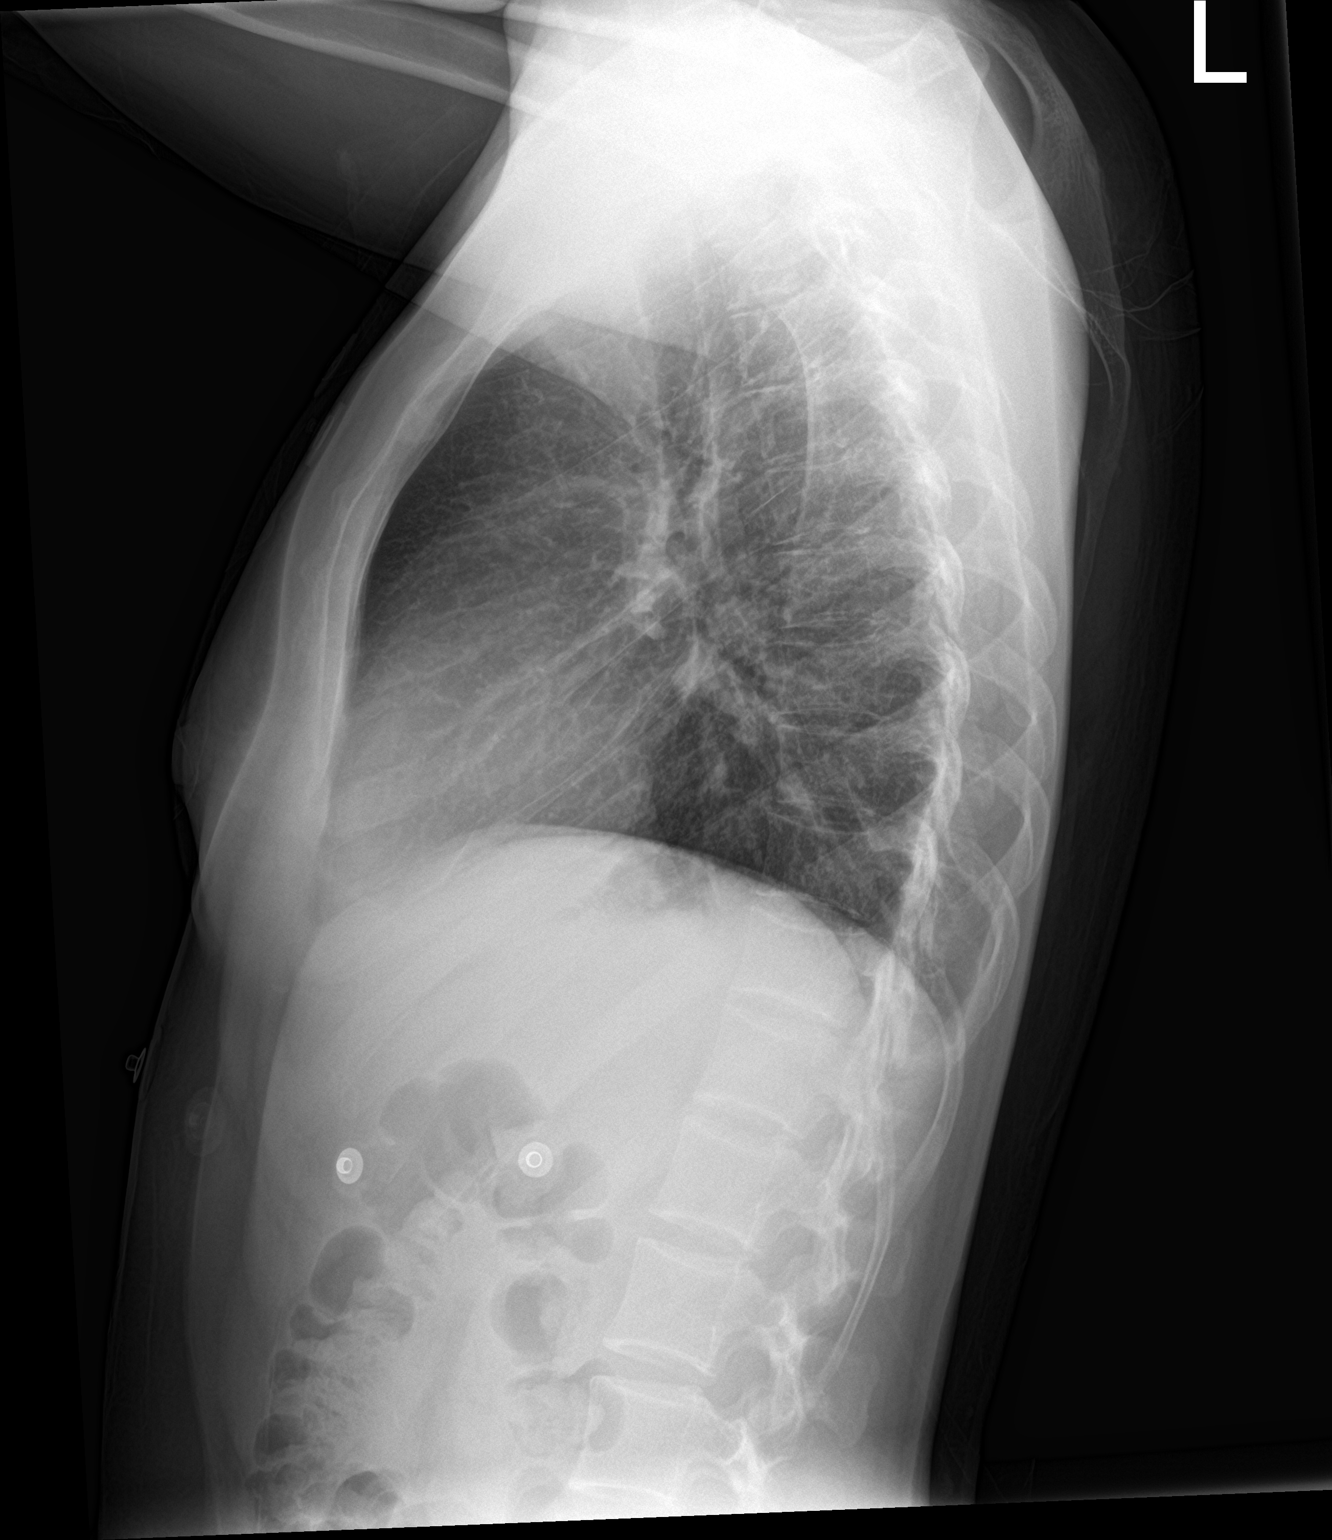

[2 of 2 positions shown; findings below may reference images not displayed]

FINDINGS: The heart size and mediastinal contours are within normal limits.
Both lungs are clear. The visualized skeletal structures are
unremarkable.
IMPRESSION: No active cardiopulmonary disease.

## 2022-03-23 IMAGING — CT CT HEAD W/O CM
4 series · 17 of 47 positions shown, 19 images · non-contrast
Comparison: None.

CLINICAL DATA: Syncope. Orthostatics changes. Abnormal neurologic
exam.

EXAM:
CT HEAD WITHOUT CONTRAST
TECHNIQUE: Contiguous axial images were obtained from the base of the skull
through the vertex without intravenous contrast.

[Series 3: head wo · axial · 0.39mm/px · z∈[+531,+661]mm · 7 of 36 slices shown, 9 images]
[im 5/36  brain]
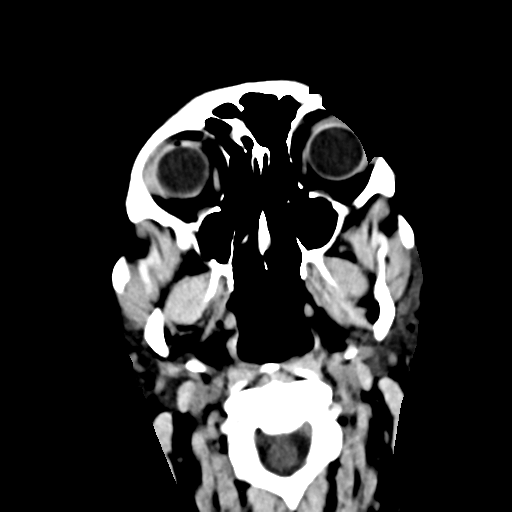
[im 5/36  bone]
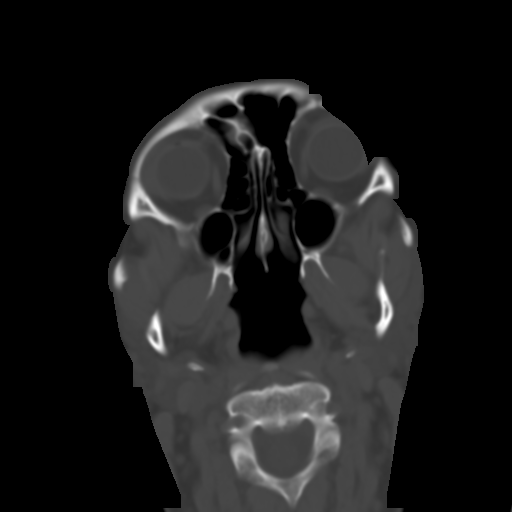
[im 9/36  brain]
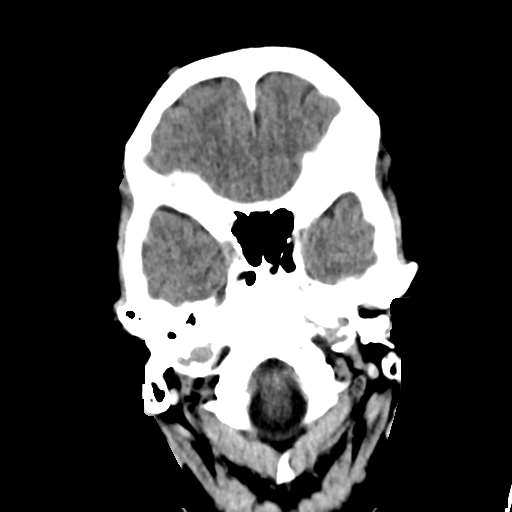
[im 14/36  brain]
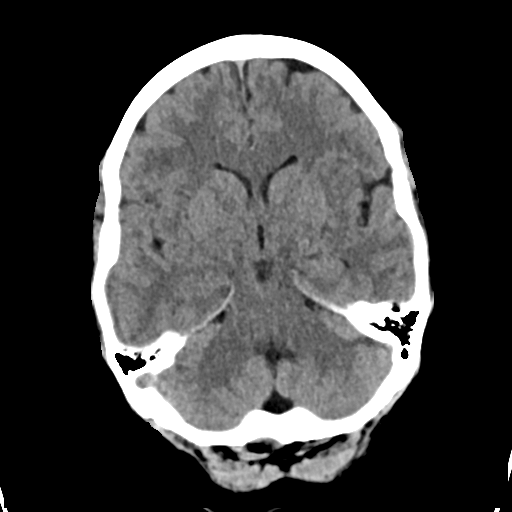
[im 18/36  brain]
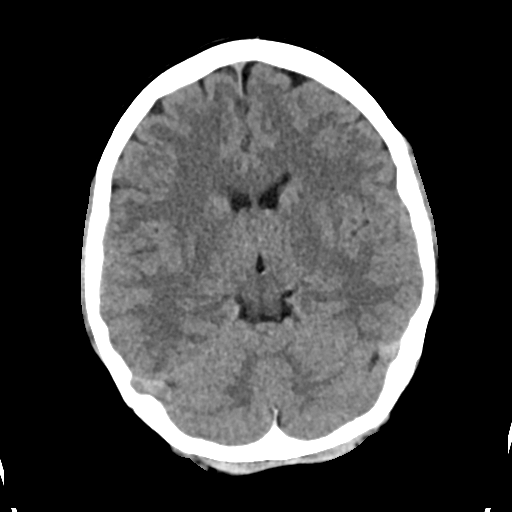
[im 22/36  brain]
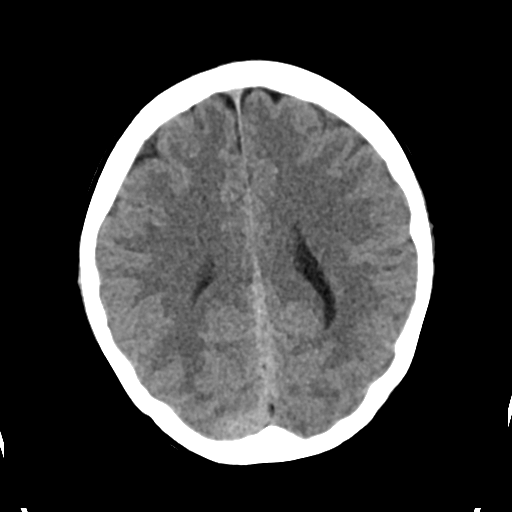
[im 22/36  bone]
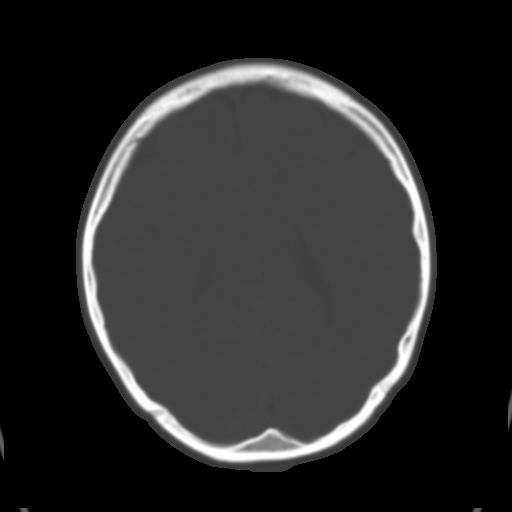
[im 27/36  brain]
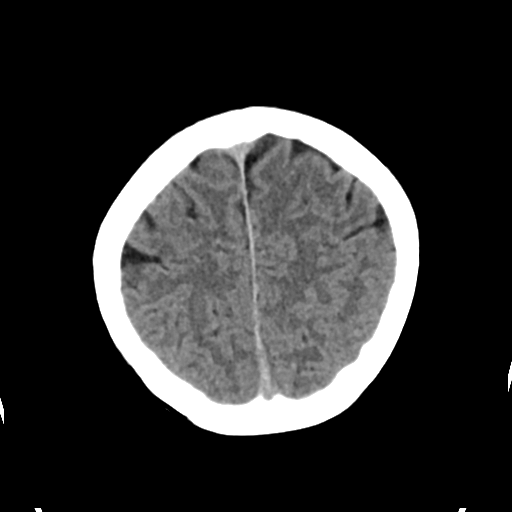
[im 31/36  brain]
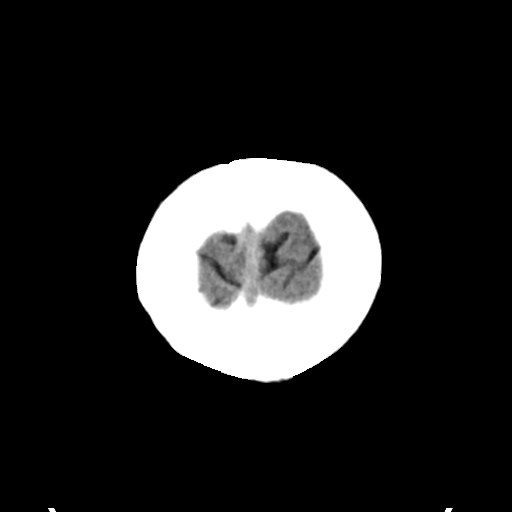

[Series 4: head bone · axial · 0.39mm/px · z∈[+527,+591]mm · 4 of 90 slices shown]
[im 9/90  bone]
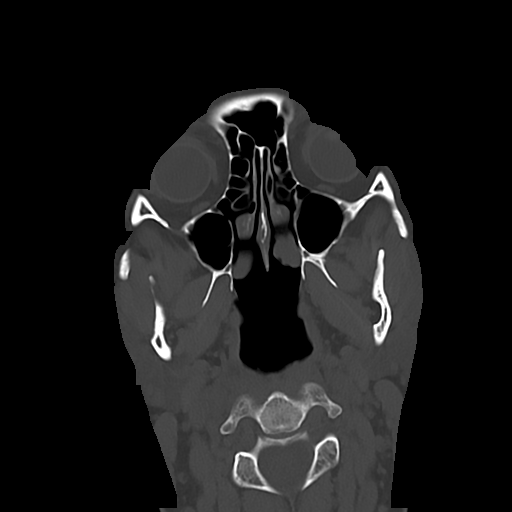
[im 18/90  bone]
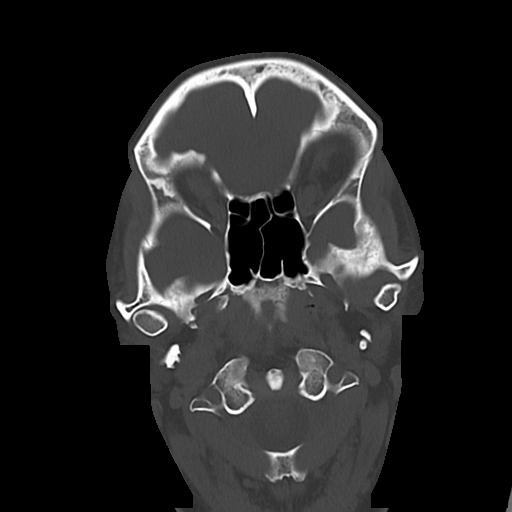
[im 27/90  bone]
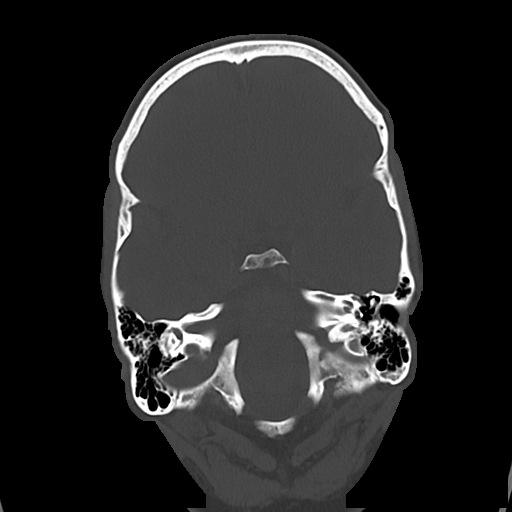
[im 41/90  bone]
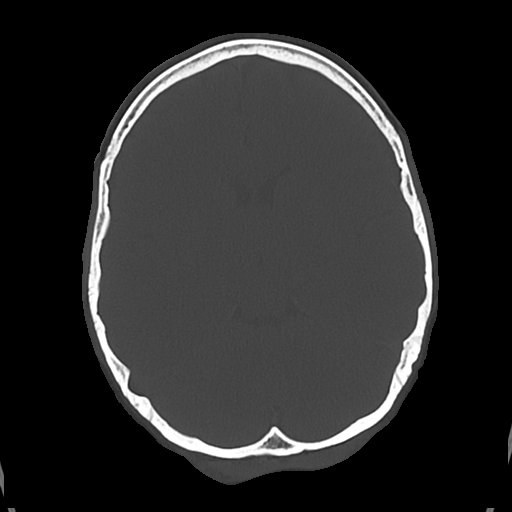

[Series 5: cor soft · coronal · 0.32mm/px · 3 of 67 slices shown]
[im 23/67  brain]
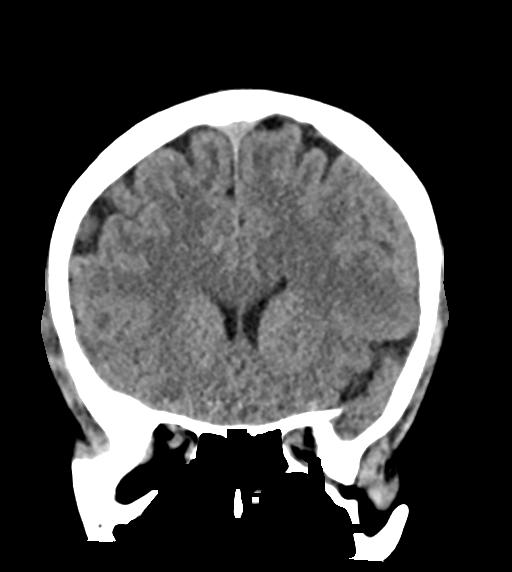
[im 30/67  brain]
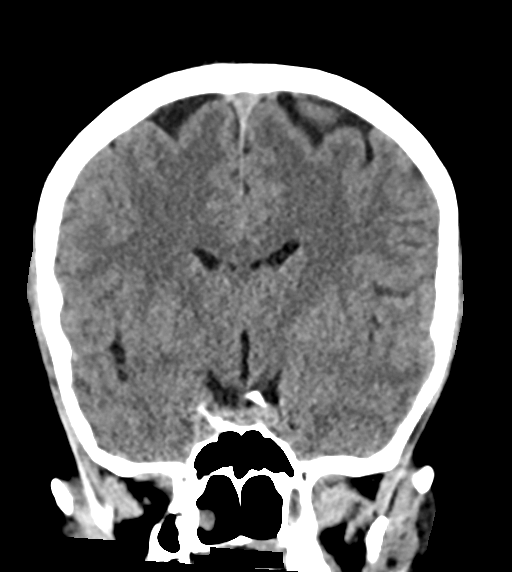
[im 37/67  brain]
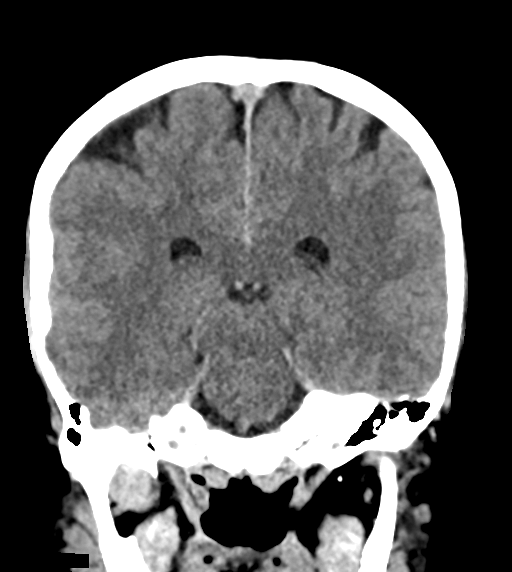

[Series 6: sag soft · sagittal · 0.35mm/px · 3 of 55 slices shown]
[im 19/55  brain]
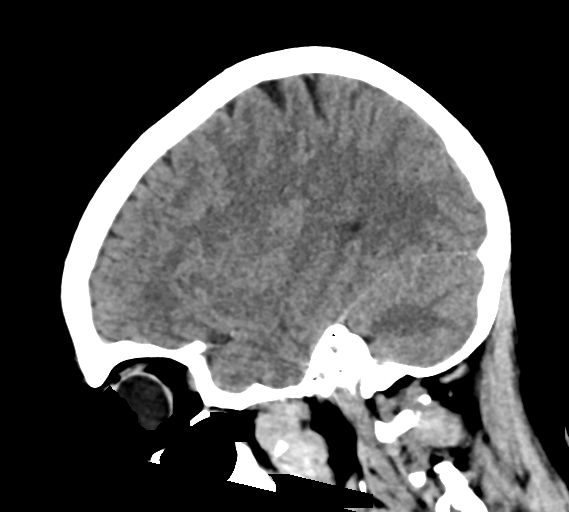
[im 28/55  brain]
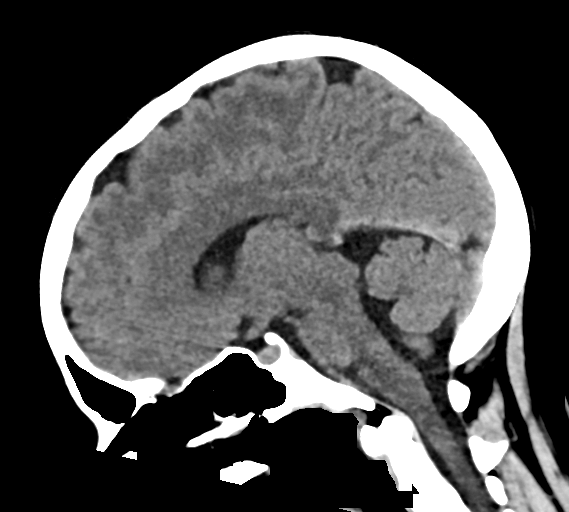
[im 37/55  brain]
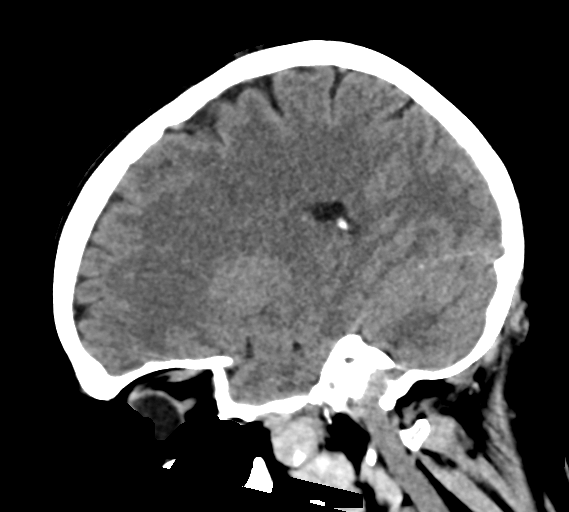

[17 of 47 positions shown; findings below may reference images not displayed]

FINDINGS: Brain: No evidence of acute infarction, hemorrhage, hydrocephalus,
extra-axial collection or mass lesion/mass effect.

Vascular: No hyperdense vessel or unexpected calcification.

Skull: Normal. Negative for fracture or focal lesion.

Sinuses/Orbits: No acute finding.

Other: None.
IMPRESSION: 1. No cause for the patient's symptoms identified.  Normal study.

## 2022-03-27 ENCOUNTER — Encounter (HOSPITAL_COMMUNITY): Payer: Self-pay

## 2022-03-27 ENCOUNTER — Inpatient Hospital Stay (HOSPITAL_COMMUNITY)
Admission: AD | Admit: 2022-03-27 | Discharge: 2022-03-27 | Disposition: A | Discharge status: Home / Self Care | Payer: Self-pay | Attending: Family Medicine | Admitting: Family Medicine

## 2022-03-27 DIAGNOSIS — Z5941 Food insecurity: Secondary | ICD-10-CM | POA: Insufficient documentation

## 2022-03-27 DIAGNOSIS — R040 Epistaxis: Secondary | ICD-10-CM

## 2022-03-27 DIAGNOSIS — Z789 Other specified health status: Secondary | ICD-10-CM

## 2022-03-27 DIAGNOSIS — O99612 Diseases of the digestive system complicating pregnancy, second trimester: Secondary | ICD-10-CM | POA: Insufficient documentation

## 2022-03-27 DIAGNOSIS — O26892 Other specified pregnancy related conditions, second trimester: Secondary | ICD-10-CM | POA: Insufficient documentation

## 2022-03-27 DIAGNOSIS — Z603 Acculturation difficulty: Secondary | ICD-10-CM

## 2022-03-27 DIAGNOSIS — N898 Other specified noninflammatory disorders of vagina: Secondary | ICD-10-CM

## 2022-03-27 DIAGNOSIS — K5900 Constipation, unspecified: Secondary | ICD-10-CM

## 2022-03-27 DIAGNOSIS — R109 Unspecified abdominal pain: Secondary | ICD-10-CM | POA: Insufficient documentation

## 2022-03-27 DIAGNOSIS — Z3A17 17 weeks gestation of pregnancy: Secondary | ICD-10-CM

## 2022-03-27 LAB — URINALYSIS, ROUTINE W REFLEX MICROSCOPIC
Bilirubin Urine: NEGATIVE
Glucose, UA: NEGATIVE mg/dL
Hgb urine dipstick: NEGATIVE
Ketones, ur: NEGATIVE mg/dL
Leukocytes,Ua: NEGATIVE
Nitrite: NEGATIVE
Protein, ur: NEGATIVE mg/dL
Specific Gravity, Urine: 1.004 — ABNORMAL LOW (ref 1.005–1.030)
pH: 6 (ref 5.0–8.0)

## 2022-03-27 LAB — AMNISURE RUPTURE OF MEMBRANE (ROM) NOT AT ARMC: Amnisure ROM: NEGATIVE

## 2022-03-27 LAB — WET PREP, GENITAL
Clue Cells Wet Prep HPF POC: NONE SEEN
Sperm: NONE SEEN
Trich, Wet Prep: NONE SEEN
WBC, Wet Prep HPF POC: >=10 — AB (ref <10)
Yeast Wet Prep HPF POC: NONE SEEN

## 2022-03-27 LAB — POCT FERN TEST: POCT Fern Test: NEGATIVE

## 2022-03-27 MED ORDER — ACETAMINOPHEN 500 MG PO TABS
1000.0000 mg | ORAL_TABLET | Freq: Once | ORAL | Status: AC
  Administered 2022-03-27: 1000 mg via ORAL
  Filled 2022-03-27: qty 2

## 2022-03-27 MED ORDER — POLYETHYLENE GLYCOL 3350 17 G PO PACK
17.0000 g | PACK | Freq: Every day | ORAL | 3 refills | Status: DC
Start: 2022-03-27 — End: 2022-08-07

## 2022-03-27 NOTE — MAU Provider Note (Signed)
History     [id number]  Arrival date and time: 03/27/22 1421    Chief Complaint  Patient presents with   Constipation   Epistaxis   Abdominal Pain     HPI Carolyn Carroll Tamarra Geiselman is a 27 y.o. at [redacted]w[redacted]d by LMP who presents for multiple concerns: Abdominal pain with associated constipation for 12 days. She has tried fruit and prune juice for measures to help. The pain is in the middle lower part of her abdomen.  Nosebleeds starting 4 days ago and occurring multiple times a day.  Vaginal discharge that is yellow and thin/watery. It has been for a couple days. She notices it primarily after going to the bathroom.   She has not had any prenatal care since her last MAU visit. She tried to establish at [facility] but they told her she would have to pay 128 dollars for her first visit and she does not have that money. Her husband has unfortunately been unemployed for some months.   --/--/O POS (04/30 1720)  OB History     Gravida  4   Para  3   Term  3   Preterm      AB      Living  3      SAB      IAB      Ectopic      Multiple  0   Live Births  3           Past Medical History:  Diagnosis Date   Medical history non-contributory     Past Surgical History:  Procedure Laterality Date   NO PAST SURGERIES      Family History  Problem Relation Age of Onset   Anemia Mother    Diabetes Neg Hx    Hypertension Neg Hx     Social History   Socioeconomic History   Marital status: Single    Spouse name: Not on file   Number of children: Not on file   Years of education: Not on file   Highest education level: Not on file  Occupational History   Not on file  Tobacco Use   Smoking status: Never   Smokeless tobacco: Never  Vaping Use   Vaping Use: Never used  Substance and Sexual Activity   Alcohol use: Not Currently   Drug use: Never   Sexual activity: Yes    Birth control/protection: None  Other Topics Concern   Not on file  Social History  Narrative   Not on file   Social Determinants of Health   Financial Resource Strain: Not on file  Food Insecurity: Food Insecurity Present (04/19/2021)   Hunger Vital Sign    Worried About Running Out of Food in the Last Year: Sometimes true    Ran Out of Food in the Last Year: Sometimes true  Transportation Needs: No Transportation Needs (04/19/2021)   PRAPARE - Administrator, Civil Service (Medical): No    Lack of Transportation (Non-Medical): No  Physical Activity: Not on file  Stress: Not on file  Social Connections: Not on file  Intimate Partner Violence: Not on file    No Known Allergies  No current facility-administered medications on file prior to encounter.   Current Outpatient Medications on File Prior to Encounter  Medication Sig Dispense Refill   Prenatal Vit-Fe Fumarate-FA (PRENATAL MULTIVITAMIN) TABS tablet Take 1 tablet by mouth daily at 12 noon.     acetaminophen (TYLENOL) 500 MG tablet Take  2 tablets (1,000 mg total) by mouth every 8 (eight) hours as needed (pain). 60 tablet 0   ibuprofen (ADVIL) 600 MG tablet Take 1 tablet (600 mg total) by mouth every 6 (six) hours as needed (pain). 40 tablet 0     Review of Systems  Constitutional: Negative.   HENT: Negative.    Eyes: Negative.   Respiratory: Negative.    Cardiovascular: Negative.   Gastrointestinal:  Positive for abdominal pain and constipation. Negative for blood in stool, diarrhea, heartburn, nausea and vomiting.  Genitourinary: Negative.        Vaginal discharge  Musculoskeletal: Negative.   Skin: Negative.   Neurological: Negative.   Endo/Heme/Allergies: Negative.   Psychiatric/Behavioral: Negative.     Pertinent positives and negative per HPI, all others reviewed and negative  Physical Exam   BP (!) 98/57   Pulse (!) 59   Temp 98.2 F (36.8 C) (Oral)   Resp 16   Wt 53.2 kg   LMP 11/23/2021   SpO2 100%   BMI 25.38 kg/m   Patient Vitals for the past 24 hrs:  BP Temp Temp  src Pulse Resp SpO2 Weight  03/27/22 1748 (!) 98/57 -- -- (!) 59 -- -- --  03/27/22 1507 109/66 98.2 F (36.8 C) Oral 61 16 100 % 53.2 kg    Physical Exam Constitutional:      Appearance: She is well-developed and normal weight.  HENT:     Head: Normocephalic and atraumatic.     Mouth/Throat:     Mouth: Mucous membranes are moist.     Pharynx: Oropharynx is clear.  Eyes:     Extraocular Movements: Extraocular movements intact.     Pupils: Pupils are equal, round, and reactive to light.  Cardiovascular:     Rate and Rhythm: Normal rate and regular rhythm.  Pulmonary:     Effort: Pulmonary effort is normal.  Abdominal:     General: Abdomen is flat. Bowel sounds are normal.     Palpations: Abdomen is soft.  Genitourinary:    Vagina: Normal.     Cervix: No discharge or friability.  Skin:    General: Skin is warm.     Capillary Refill: Capillary refill takes less than 2 seconds.  Neurological:     General: No focal deficit present.     Mental Status: She is alert and oriented to person, place, and time.  Psychiatric:        Mood and Affect: Mood normal.        Behavior: Behavior normal.     Cervical Exam  Visually closed. Not digitally examined.   Chaperone present for SSE and CE.   FHT obtained and in the 160s.   Labs Results for orders placed or performed during the hospital encounter of 03/27/22 (from the past 24 hour(s))  Urinalysis, Routine w reflex microscopic Urine, Clean Catch     Status: Abnormal   Collection Time: 03/27/22  3:27 PM  Result Value Ref Range   Color, Urine STRAW (A) YELLOW   APPearance CLEAR CLEAR   Specific Gravity, Urine 1.004 (L) 1.005 - 1.030   pH 6.0 5.0 - 8.0   Glucose, UA NEGATIVE NEGATIVE mg/dL   Hgb urine dipstick NEGATIVE NEGATIVE   Bilirubin Urine NEGATIVE NEGATIVE   Ketones, ur NEGATIVE NEGATIVE mg/dL   Protein, ur NEGATIVE NEGATIVE mg/dL   Nitrite NEGATIVE NEGATIVE   Leukocytes,Ua NEGATIVE NEGATIVE  Wet prep, genital      Status: Abnormal   Collection Time: 03/27/22  5:00 PM  Result Value Ref Range   Yeast Wet Prep HPF POC NONE SEEN NONE SEEN   Trich, Wet Prep NONE SEEN NONE SEEN   Clue Cells Wet Prep HPF POC NONE SEEN NONE SEEN   WBC, Wet Prep HPF POC >=10 (A) <10   Sperm NONE SEEN   Amnisure rupture of membrane (rom)not at [facility]     Status: None   Collection Time: 03/27/22  5:00 PM  Result Value Ref Range   Amnisure ROM NEGATIVE   Fern Test     Status: Normal   Collection Time: 03/27/22  5:02 PM  Result Value Ref Range   POCT Fern Test Negative = intact amniotic membranes      MAU Course  Procedures Lab Orders         Wet prep, genital         Urinalysis, Routine w reflex microscopic         Amnisure rupture of membrane (rom)not at [place] ordered this encounter  Medications   acetaminophen (TYLENOL) tablet 1,000 mg   polyethylene glycol (MIRALAX) 17 g packet    Sig: Take 17 g by mouth daily.    Dispense:  30 each    Refill:  3   Imaging Orders  No imaging studies ordered today    MDM moderate  Assessment and Plan  Constipation/Abdominal pain - Soap suds enema given as well as tylenol which helped resolve the patient pain.  - Discussed OTC measures that can help as well as diet changes that may helps as well.   Vaginal discharge - Culture shows negative for yeast or BV.  GC/CT pending but low clinical suspicion.  - Negative for rupture by fern and amnisure.   Nose bleeds - Discussed saline nasal spray to help. Avoid anything that is vasoconstrictive.   Language barrier - Spanish interpreter used throughout our interaction.   Discharge Instructions     Activity as tolerated - No restrictions   Complete by: As directed    Call MD for:   Complete by: As directed    Decreased fetal movement, contractions, and vaginal bleeding.   Call MD for:  difficulty breathing, headache or visual disturbances   Complete by: As directed    Call MD for:  persistant  nausea and vomiting   Complete by: As directed    Call MD for:  redness, tenderness, or signs of infection (pain, swelling, redness, odor or green/yellow discharge around incision site)   Complete by: As directed    Call MD for:  severe uncontrolled pain   Complete by: As directed    Call MD for:  temperature >100.4   Complete by: As directed    Diet general   Complete by: As directed    May shower / Bathe   Complete by: As directed        Radene Gunning, MD 03/27/22 5:52 PM  Allergies as of 03/27/2022   No Known Allergies      Medication List     STOP taking these medications    ibuprofen 600 MG tablet Commonly known as: ADVIL       TAKE these medications    acetaminophen 500 MG tablet Commonly known as: TYLENOL Take 2 tablets (1,000 mg total) by mouth every 8 (eight) hours as needed (pain).   polyethylene glycol 17 g packet Commonly known as: MiraLax Take 17 g by mouth daily.  prenatal multivitamin Tabs tablet Take 1 tablet by mouth daily at 12 noon.

## 2022-03-27 NOTE — MAU Note (Signed)
.  Carolyn Carroll is a 27 y.o. at Unknown here in MAU reporting: She has not pooped in 12 days and she has been getting nosebleeds. She has taken prune juice with no relief. She reports that the nosebleeds started four days ago and she has 2-3 episodes per day. She reports lower abdominal cramping (7/10) that is intermittent and began two days ago. She reports a thin, watery, yellow fluid that started leaking yesterday and has continued to leak since. She mainly notices the leaking when she wipes after going to the bathroom. Denies VB. Patient reports no prenatal care.   LMP: 11/23/2021 Onset of complaint: On-going Pain score: 7/10 Vitals:   03/27/22 1507  BP: 109/66  Pulse: 61  Resp: 16  Temp: 98.2 F (36.8 C)  SpO2: 100%     FHT:160 Lab orders placed from triage:  UA

## 2022-03-28 LAB — GC/CHLAMYDIA PROBE AMP (~~LOC~~) NOT AT ARMC
Chlamydia: NEGATIVE
Comment: NEGATIVE
Comment: NORMAL
Neisseria Gonorrhea: NEGATIVE

## 2022-06-06 LAB — OB RESULTS CONSOLE HGB/HCT, BLOOD
HCT: 36 (ref 29–41)
Hemoglobin: 11.3

## 2022-06-06 LAB — OB RESULTS CONSOLE VARICELLA ZOSTER ANTIBODY, IGG: Varicella: IMMUNE

## 2022-06-06 LAB — OB RESULTS CONSOLE PLATELET COUNT: Platelets: 214

## 2022-06-06 LAB — OB RESULTS CONSOLE RPR: RPR: NONREACTIVE

## 2022-06-08 LAB — OB RESULTS CONSOLE GC/CHLAMYDIA
Chlamydia: NEGATIVE
Neisseria Gonorrhea: NEGATIVE

## 2022-06-15 DIAGNOSIS — A599 Trichomoniasis, unspecified: Secondary | ICD-10-CM

## 2022-06-15 DIAGNOSIS — Z8751 Personal history of pre-term labor: Secondary | ICD-10-CM | POA: Insufficient documentation

## 2022-07-10 ENCOUNTER — Encounter: Payer: Self-pay | Admitting: Obstetrics and Gynecology

## 2022-07-10 ENCOUNTER — Ambulatory Visit (INDEPENDENT_AMBULATORY_CARE_PROVIDER_SITE_OTHER): Payer: Self-pay | Admitting: Obstetrics and Gynecology

## 2022-07-10 VITALS — BP 113/67 | HR 64 | Wt 121.3 lb

## 2022-07-10 DIAGNOSIS — O0933 Supervision of pregnancy with insufficient antenatal care, third trimester: Secondary | ICD-10-CM

## 2022-07-10 DIAGNOSIS — Z758 Other problems related to medical facilities and other health care: Secondary | ICD-10-CM

## 2022-07-10 DIAGNOSIS — Z603 Acculturation difficulty: Secondary | ICD-10-CM

## 2022-07-10 DIAGNOSIS — Z3A32 32 weeks gestation of pregnancy: Secondary | ICD-10-CM

## 2022-07-10 DIAGNOSIS — Z8751 Personal history of pre-term labor: Secondary | ICD-10-CM

## 2022-07-10 DIAGNOSIS — O0990 Supervision of high risk pregnancy, unspecified, unspecified trimester: Secondary | ICD-10-CM | POA: Insufficient documentation

## 2022-07-10 NOTE — Progress Notes (Signed)
   PRENATAL VISIT NOTE  Subjective:  Carolyn Carroll is a 27 y.o. [phone] at [redacted]w[redacted]d being seen today for ongoing prenatal care.  She is currently monitored for the following issues for this low-risk pregnancy and has Back pain affecting pregnancy; Limited prenatal care in third trimester; Language barrier; Vaginal delivery; History of preterm delivery; Trichomonas infection; and Supervision of high risk pregnancy, antepartum on their problem list.  Patient doing well with no acute concerns today. She reports no complaints.  Contractions: Not present. Vag. Bleeding: None.  Movement: Present. Denies leaking of fluid.   The following portions of the patient's history were reviewed and updated as appropriate: allergies, current medications, past family history, past medical history, past social history, past surgical history and problem list. Problem list updated.  Objective:   Vitals:   07/10/22 1004  BP: 113/67  Pulse: 64  Weight: 121 lb 4.8 oz (55 kg)    Fetal Status: Fetal Heart Rate (bpm): 137 Fundal Height: 32 cm Movement: Present     General:  Alert, oriented and cooperative. Patient is in no acute distress.  Skin: Skin is warm and dry. No rash noted.   Cardiovascular: Normal heart rate noted  Respiratory: Normal respiratory effort, no problems with respiration noted  Abdomen: Soft, gravid, appropriate for gestational age.  Pain/Pressure: Present     Pelvic: Cervical exam deferred        Extremities: Normal range of motion.  Edema: Trace  Mental Status:  Normal mood and affect. Normal behavior. Normal judgment and thought content.   Assessment and Plan:  Pregnancy: G4P3003 at [redacted]w[redacted]d  1. Supervision of high risk pregnancy, antepartum Pt seen as transfer of care from [facility] 2/2 hx of preterm delivery.  Last delivery was term at 37 weeks.  2. [redacted] weeks gestation of pregnancy   3. Limited prenatal care in third trimester Pt initiated prenatal care at 27 weeks  4.  Language barrier Live interpreter present  5. History of preterm delivery No s/sx of preterm labor  Preterm labor symptoms and general obstetric precautions including but not limited to vaginal bleeding, contractions, leaking of fluid and fetal movement were reviewed in detail with the patient.  Please refer to After Visit Summary for other counseling recommendations.   Return in about 2 weeks (around 07/24/2022) for ROB, in person.   Mariel Aloe, MD Faculty Attending Center for [facility]

## 2022-07-10 NOTE — Progress Notes (Deleted)
   PRENATAL VISIT NOTE  Subjective:  Carolyn Carroll is a 27 y.o. [phone] at [redacted]w[redacted]d being seen today for ongoing prenatal care.  She is currently monitored for the following issues for this low-risk pregnancy and has Back pain affecting pregnancy; Limited prenatal care in third trimester; Language barrier; Vaginal delivery; History of preterm delivery; Trichomonas infection; and Supervision of high risk pregnancy, antepartum on their problem list.  Patient doing well with no acute concerns today. She reports no complaints.  Contractions: Not present. Vag. Bleeding: None.  Movement: Present. Denies leaking of fluid.   The following portions of the patient's history were reviewed and updated as appropriate: allergies, current medications, past family history, past medical history, past social history, past surgical history and problem list. Problem list updated.  Objective:   Vitals:   07/10/22 1004  BP: 113/67  Pulse: 64  Weight: 121 lb 4.8 oz (55 kg)    Fetal Status: Fetal Heart Rate (bpm): 137 Fundal Height: 32 cm Movement: Present     General:  Alert, oriented and cooperative. Patient is in no acute distress.  Skin: Skin is warm and dry. No rash noted.   Cardiovascular: Normal heart rate noted  Respiratory: Normal respiratory effort, no problems with respiration noted  Abdomen: Soft, gravid, appropriate for gestational age.  Pain/Pressure: Present     Pelvic: Cervical exam deferred        Extremities: Normal range of motion.  Edema: Trace  Mental Status:  Normal mood and affect. Normal behavior. Normal judgment and thought content.   Assessment and Plan:  Pregnancy: G4P3003 at [redacted]w[redacted]d  1. Supervision of high risk pregnancy, antepartum ***  2. [redacted] weeks gestation of pregnancy ***  {Blank single:19197::"Term","Preterm"} labor symptoms and general obstetric precautions including but not limited to vaginal bleeding, contractions, leaking of fluid and fetal movement were  reviewed in detail with the patient.  Please refer to After Visit Summary for other counseling recommendations.   Return in about 2 weeks (around 07/24/2022) for ROB, in person.   Mariel Aloe, MD Faculty Attending Center for [facility]

## 2022-07-24 ENCOUNTER — Telehealth: Payer: Self-pay | Admitting: Family Medicine

## 2022-07-24 ENCOUNTER — Encounter: Payer: Self-pay | Admitting: Obstetrics & Gynecology

## 2022-07-24 NOTE — Telephone Encounter (Signed)
Patient no showed her appointment today, called patient with [facility] with no answer

## 2022-08-06 LAB — OB RESULTS CONSOLE RUBELLA ANTIBODY, IGM: Rubella: IMMUNE

## 2022-08-07 ENCOUNTER — Other Ambulatory Visit (HOSPITAL_COMMUNITY)
Admission: RE | Admit: 2022-08-07 | Discharge: 2022-08-07 | Disposition: A | Discharge status: Home / Self Care | Payer: Self-pay | Source: Ambulatory Visit | Attending: Obstetrics & Gynecology | Admitting: Obstetrics & Gynecology

## 2022-08-07 ENCOUNTER — Ambulatory Visit (INDEPENDENT_AMBULATORY_CARE_PROVIDER_SITE_OTHER): Payer: Self-pay | Admitting: Obstetrics and Gynecology

## 2022-08-07 ENCOUNTER — Other Ambulatory Visit: Payer: Self-pay

## 2022-08-07 VITALS — BP 109/70 | HR 63 | Wt 124.6 lb

## 2022-08-07 DIAGNOSIS — O09893 Supervision of other high risk pregnancies, third trimester: Secondary | ICD-10-CM | POA: Insufficient documentation

## 2022-08-07 DIAGNOSIS — O288 Other abnormal findings on antenatal screening of mother: Secondary | ICD-10-CM

## 2022-08-07 DIAGNOSIS — O26893 Other specified pregnancy related conditions, third trimester: Secondary | ICD-10-CM

## 2022-08-07 DIAGNOSIS — N898 Other specified noninflammatory disorders of vagina: Secondary | ICD-10-CM | POA: Insufficient documentation

## 2022-08-07 DIAGNOSIS — Z758 Other problems related to medical facilities and other health care: Secondary | ICD-10-CM

## 2022-08-07 DIAGNOSIS — Z603 Acculturation difficulty: Secondary | ICD-10-CM

## 2022-08-07 DIAGNOSIS — O0933 Supervision of pregnancy with insufficient antenatal care, third trimester: Secondary | ICD-10-CM

## 2022-08-07 DIAGNOSIS — A599 Trichomoniasis, unspecified: Secondary | ICD-10-CM

## 2022-08-07 DIAGNOSIS — Z3A36 36 weeks gestation of pregnancy: Secondary | ICD-10-CM

## 2022-08-07 DIAGNOSIS — Z8751 Personal history of pre-term labor: Secondary | ICD-10-CM

## 2022-08-07 NOTE — Progress Notes (Signed)
Reports vaginal discharge with pelvic pain for the past week

## 2022-08-07 NOTE — Progress Notes (Signed)
   PRENATAL VISIT NOTE  Subjective:  Carolyn Carroll is a 27 y.o. [phone] at [redacted]w[redacted]d being seen today for ongoing prenatal care.  She is currently monitored for the following issues for this low-risk pregnancy and has Back pain affecting pregnancy; Limited prenatal care in third trimester; Language barrier; History of preterm delivery; Trichomonas infection; Supervision of high risk pregnancy, antepartum; and Short interval between pregnancies affecting pregnancy in third trimester, antepartum on their problem list.  Patient reports no complaints.  Contractions: Irritability. Vag. Bleeding: None.  Movement: Present. Denies leaking of fluid.   The following portions of the patient's history were reviewed and updated as appropriate: allergies, current medications, past family history, past medical history, past social history, past surgical history and problem list.   Objective:   Vitals:   08/07/22 1134  BP: 109/70  Pulse: 63  Weight: 124 lb 9.6 oz (56.5 kg)   Fetal Status: Fetal Heart Rate (bpm): 146 Fundal Height: 35 cm Movement: Present  Presentation: Vertex  General:  Alert, oriented and cooperative. Patient is in no acute distress.  Skin: Skin is warm and dry. No rash noted.   Cardiovascular: Normal heart rate noted  Respiratory: Normal respiratory effort, no problems with respiration noted  Abdomen: Soft, gravid, appropriate for gestational age.  Pain/Pressure: Present     Pelvic: Cervical exam deferred        Extremities: Normal range of motion.  Edema: None  Mental Status: Normal mood and affect. Normal behavior. Normal judgment and thought content.   Assessment and Plan:  Pregnancy: G4P3003 at [redacted]w[redacted]d 1. Short interval between pregnancies affecting pregnancy in third trimester, antepartum - Strep Gp B NAA - Cervicovaginal ancillary only( [place])  2. Trichomonas infection Test of cure today  3. History of preterm delivery Per patient, from fall in  2019  4. Limited prenatal care in third trimester  5. Language barrier In person interpreter used  6. Vaginal discharge during pregnancy in third trimester - Cervicovaginal ancillary only( [place])  7. 36 weeks pregnancy [facility] in [place] called and they will send over her hepB and hiv results. Pt states she had April u/s there and they confirm this and will send this over. They state the u/s was on 4/25 with [facility] 7/9  Preterm labor symptoms and general obstetric precautions including but not limited to vaginal bleeding, contractions, leaking of fluid and fetal movement were reviewed in detail with the patient. Please refer to After Visit Summary for other counseling recommendations.   No follow-ups on file.  Future Appointments  Date Time Provider Department Center  08/14/2022  3:15 PM Adam Phenix, MD [facility]    [place] Bing, MD

## 2022-08-08 LAB — CERVICOVAGINAL ANCILLARY ONLY
Bacterial Vaginitis (gardnerella): NEGATIVE
Candida Glabrata: NEGATIVE
Candida Vaginitis: POSITIVE — AB
Chlamydia: NEGATIVE
Comment: NEGATIVE
Comment: NEGATIVE
Comment: NEGATIVE
Comment: NEGATIVE
Comment: NEGATIVE
Comment: NORMAL
Neisseria Gonorrhea: NEGATIVE
Trichomonas: NEGATIVE

## 2022-08-08 MED ORDER — MICONAZOLE NITRATE 2 % VA CREA: 1.0000 | TOPICAL_CREAM | Freq: Every day | VAGINAL | 2 refills | Status: AC

## 2022-08-08 NOTE — Addendum Note (Signed)
Addended by: [place] Bing on: 08/08/2022 10:32 PM   Modules accepted: Orders

## 2022-08-09 ENCOUNTER — Telehealth: Payer: Self-pay

## 2022-08-09 LAB — STREP GP B NAA: Strep Gp B NAA: NEGATIVE

## 2022-08-09 NOTE — Telephone Encounter (Addendum)
-----   Message from [place] Bing, MD sent at 08/08/2022 10:31 PM EDT ----- Can you let her know that regular, over the counter monistat 7 (generic is fine) is best for her yeast infection? I didn't send it in to the pharmacy but I added it to her med list so she can look at that for the name. Thanks  Attempted to contact pt via telephone numbers in chart x 2 and unable to leave message as no voicemail set up.    Carolyn Carroll  08/09/22

## 2022-08-14 ENCOUNTER — Ambulatory Visit (INDEPENDENT_AMBULATORY_CARE_PROVIDER_SITE_OTHER): Payer: Self-pay | Admitting: Obstetrics & Gynecology

## 2022-08-14 ENCOUNTER — Other Ambulatory Visit: Payer: Self-pay

## 2022-08-14 VITALS — BP 126/77 | HR 79 | Wt 125.5 lb

## 2022-08-14 DIAGNOSIS — Z758 Other problems related to medical facilities and other health care: Secondary | ICD-10-CM

## 2022-08-14 DIAGNOSIS — Z603 Acculturation difficulty: Secondary | ICD-10-CM

## 2022-08-14 DIAGNOSIS — Z8751 Personal history of pre-term labor: Secondary | ICD-10-CM

## 2022-08-14 DIAGNOSIS — O0990 Supervision of high risk pregnancy, unspecified, unspecified trimester: Secondary | ICD-10-CM

## 2022-08-14 DIAGNOSIS — Z3A37 37 weeks gestation of pregnancy: Secondary | ICD-10-CM

## 2022-08-14 DIAGNOSIS — O0993 Supervision of high risk pregnancy, unspecified, third trimester: Secondary | ICD-10-CM

## 2022-08-14 NOTE — Progress Notes (Signed)
Advised Pt that she tested + for yeast & Rx was sent to pharmacy. Pt verbalized understanding.

## 2022-08-14 NOTE — Progress Notes (Signed)
   PRENATAL VISIT NOTE  Subjective:  Carolyn Carroll is a 27 y.o. [phone] at [redacted]w[redacted]d being seen today for ongoing prenatal care.  She is currently monitored for the following issues for this low-risk pregnancy and has Back pain affecting pregnancy; Limited prenatal care in third trimester; Language barrier; History of preterm delivery; Trichomonas infection; Supervision of high risk pregnancy, antepartum; and Short interval between pregnancies affecting pregnancy in third trimester, antepartum on their problem list.  Patient reports occasional contractions.  Contractions: Irritability. Vag. Bleeding: None.  Movement: Present. Denies leaking of fluid.   The following portions of the patient's history were reviewed and updated as appropriate: allergies, current medications, past family history, past medical history, past social history, past surgical history and problem list.   Objective:   Vitals:   08/14/22 1530  BP: 126/77  Pulse: 79  Weight: 125 lb 8 oz (56.9 kg)    Fetal Status: Fetal Heart Rate (bpm): 138   Movement: Present     General:  Alert, oriented and cooperative. Patient is in no acute distress.  Skin: Skin is warm and dry. No rash noted.   Cardiovascular: Normal heart rate noted  Respiratory: Normal respiratory effort, no problems with respiration noted  Abdomen: Soft, gravid, appropriate for gestational age.  Pain/Pressure: Present     Pelvic: Cervical exam deferred        Extremities: Normal range of motion.  Edema: None  Mental Status: Normal mood and affect. Normal behavior. Normal judgment and thought content.   Assessment and Plan:  Pregnancy: G4P3003 at [redacted]w[redacted]d 1. Supervision of high risk pregnancy, antepartum   2. History of preterm delivery   3. Language barrier Spanish interpreter  Term labor symptoms and general obstetric precautions including but not limited to vaginal bleeding, contractions, leaking of fluid and fetal movement were reviewed  in detail with the patient. Please refer to After Visit Summary for other counseling recommendations.   Return in about 1 week (around 08/21/2022).  Future Appointments  Date Time Provider Department Center  08/21/2022  1:55 PM Milas Hock, MD [facility]    Scheryl Darter, MD

## 2022-08-21 ENCOUNTER — Ambulatory Visit (INDEPENDENT_AMBULATORY_CARE_PROVIDER_SITE_OTHER): Payer: Self-pay | Admitting: Obstetrics and Gynecology

## 2022-08-21 ENCOUNTER — Other Ambulatory Visit: Payer: Self-pay

## 2022-08-21 VITALS — BP 122/75 | HR 62 | Wt 129.4 lb

## 2022-08-21 DIAGNOSIS — O0990 Supervision of high risk pregnancy, unspecified, unspecified trimester: Secondary | ICD-10-CM

## 2022-08-21 DIAGNOSIS — Z3A38 38 weeks gestation of pregnancy: Secondary | ICD-10-CM

## 2022-08-21 DIAGNOSIS — Z603 Acculturation difficulty: Secondary | ICD-10-CM

## 2022-08-21 DIAGNOSIS — O0933 Supervision of pregnancy with insufficient antenatal care, third trimester: Secondary | ICD-10-CM

## 2022-08-21 DIAGNOSIS — Z758 Other problems related to medical facilities and other health care: Secondary | ICD-10-CM

## 2022-08-21 DIAGNOSIS — O0993 Supervision of high risk pregnancy, unspecified, third trimester: Secondary | ICD-10-CM

## 2022-08-21 DIAGNOSIS — O0930 Supervision of pregnancy with insufficient antenatal care, unspecified trimester: Secondary | ICD-10-CM

## 2022-08-21 NOTE — Progress Notes (Signed)
   PRENATAL VISIT NOTE  Subjective:  Carolyn Carroll is a 27 y.o. [phone] at [redacted]w[redacted]d being seen today for ongoing prenatal care.  She is currently monitored for the following issues for this low-risk pregnancy and has Language barrier; History of preterm delivery; Trichomonas infection; Supervision of high risk pregnancy, antepartum; Short interval between pregnancies affecting pregnancy in third trimester, antepartum; and Late prenatal care on their problem list.  Patient reports backache.  Contractions: Irritability.  .  Movement: Present. Denies leaking of fluid.   The following portions of the patient's history were reviewed and updated as appropriate: allergies, current medications, past family history, past medical history, past social history, past surgical history and problem list.   Objective:   Vitals:   08/21/22 1401  BP: 122/75  Pulse: 62  Weight: 129 lb 6.4 oz (58.7 kg)    Fetal Status: Fetal Heart Rate (bpm): 153   Movement: Present     General:  Alert, oriented and cooperative. Patient is in no acute distress.  Skin: Skin is warm and dry. No rash noted.   Cardiovascular: Normal heart rate noted  Respiratory: Normal respiratory effort, no problems with respiration noted  Abdomen: Soft, gravid, appropriate for gestational age.  Pain/Pressure: Absent     Pelvic: Cervical exam performed in the presence of a chaperone        Extremities: Normal range of motion.     Mental Status: Normal mood and affect. Normal behavior. Normal judgment and thought content.   Assessment and Plan:  Pregnancy: G4P3003 at [redacted]w[redacted]d 1. Supervision of high risk pregnancy, antepartum Continue current care. Discussed IOL at 41 if no labor prior to that.   2. Late prenatal care  3. Language barrier Spanish interpreter used throughout.   4. Pregnancy with 38 completed weeks gestation   Term labor symptoms and general obstetric precautions including but not limited to vaginal  bleeding, contractions, leaking of fluid and fetal movement were reviewed in detail with the patient. Please refer to After Visit Summary for other counseling recommendations.   Return in about 1 week (around 08/28/2022) for LROB VISIT, MD or APP.  Future Appointments  Date Time Provider Department Center  08/30/2022  8:35 AM [place] Bing, MD [facility]    Milas Hock, MD

## 2022-08-21 NOTE — Progress Notes (Signed)
Routine prenatal visit:  GBS swab collected 08/07/22: Negative   Concern's:  Patient informed me that she seen a "small" amount of blood in her bottoms yesterday night when she went to the restroom. She describes the blood as "light pink"   Denies any bleeding or pain today.

## 2022-08-27 ENCOUNTER — Encounter (HOSPITAL_COMMUNITY): Payer: Self-pay | Admitting: Obstetrics & Gynecology

## 2022-08-27 ENCOUNTER — Other Ambulatory Visit: Payer: Self-pay

## 2022-08-27 ENCOUNTER — Inpatient Hospital Stay (HOSPITAL_COMMUNITY)
Admission: AD | Admit: 2022-08-27 | Discharge: 2022-08-28 | DRG: 807 | Disposition: A | Discharge status: Home / Self Care | Payer: Medicaid Other | Attending: Obstetrics & Gynecology | Admitting: Obstetrics & Gynecology

## 2022-08-27 DIAGNOSIS — O26893 Other specified pregnancy related conditions, third trimester: Secondary | ICD-10-CM | POA: Diagnosis present

## 2022-08-27 DIAGNOSIS — O0930 Supervision of pregnancy with insufficient antenatal care, unspecified trimester: Secondary | ICD-10-CM

## 2022-08-27 DIAGNOSIS — O365930 Maternal care for other known or suspected poor fetal growth, third trimester, not applicable or unspecified: Principal | ICD-10-CM | POA: Diagnosis present

## 2022-08-27 DIAGNOSIS — O09893 Supervision of other high risk pregnancies, third trimester: Secondary | ICD-10-CM

## 2022-08-27 DIAGNOSIS — Z8751 Personal history of pre-term labor: Secondary | ICD-10-CM

## 2022-08-27 DIAGNOSIS — Z3A39 39 weeks gestation of pregnancy: Secondary | ICD-10-CM

## 2022-08-27 DIAGNOSIS — Z603 Acculturation difficulty: Secondary | ICD-10-CM | POA: Diagnosis present

## 2022-08-27 DIAGNOSIS — O0990 Supervision of high risk pregnancy, unspecified, unspecified trimester: Secondary | ICD-10-CM

## 2022-08-27 LAB — TYPE AND SCREEN
ABO/RH(D): O POS
Antibody Screen: NEGATIVE

## 2022-08-27 LAB — CBC
HCT: 39.7 % (ref 36.0–46.0)
Hemoglobin: 12.8 g/dL (ref 12.0–15.0)
MCH: 24.8 pg — ABNORMAL LOW (ref 26.0–34.0)
MCHC: 32.2 g/dL (ref 30.0–36.0)
MCV: 76.9 fL — ABNORMAL LOW (ref 80.0–100.0)
Platelets: 219 10*3/uL (ref 150–400)
RBC: 5.16 MIL/uL — ABNORMAL HIGH (ref 3.87–5.11)
RDW: 14.4 % (ref 11.5–15.5)
WBC: 8.6 10*3/uL (ref 4.0–10.5)
nRBC: 0 % (ref 0.0–0.2)

## 2022-08-27 LAB — RAPID HIV SCREEN (HIV 1/2 AB+AG)
HIV 1/2 Antibodies: NONREACTIVE
HIV-1 P24 Antigen - HIV24: NONREACTIVE

## 2022-08-27 LAB — HEPATITIS B SURFACE ANTIGEN: Hepatitis B Surface Ag: NONREACTIVE

## 2022-08-27 LAB — RPR: RPR Ser Ql: NONREACTIVE

## 2022-08-27 MED ORDER — FENTANYL CITRATE (PF) 100 MCG/2ML IJ SOLN
100.0000 ug | INTRAMUSCULAR | Status: DC | PRN
  Administered 2022-08-27: 100 ug via INTRAVENOUS
  Filled 2022-08-27: qty 2

## 2022-08-27 MED ORDER — OXYTOCIN-SODIUM CHLORIDE 30-0.9 UT/500ML-% IV SOLN
2.5000 [IU]/h | INTRAVENOUS | Status: DC
  Administered 2022-08-27: 2.5 [IU]/h via INTRAVENOUS
  Filled 2022-08-27: qty 500

## 2022-08-27 MED ORDER — ONDANSETRON HCL 4 MG PO TABS: 4.0000 mg | ORAL_TABLET | ORAL | Status: DC | PRN

## 2022-08-27 MED ORDER — WITCH HAZEL-GLYCERIN EX PADS: 1.0000 | MEDICATED_PAD | CUTANEOUS | Status: DC | PRN

## 2022-08-27 MED ORDER — MEASLES, MUMPS & RUBELLA VAC IJ SOLR: 0.5000 mL | Freq: Once | INTRAMUSCULAR | Status: DC

## 2022-08-27 MED ORDER — COCONUT OIL OIL: 1.0000 | TOPICAL_OIL | Status: DC | PRN

## 2022-08-27 MED ORDER — OXYCODONE HCL 5 MG PO TABS: 5.0000 mg | ORAL_TABLET | ORAL | Status: DC | PRN

## 2022-08-27 MED ORDER — TETANUS-DIPHTH-ACELL PERTUSSIS 5-2.5-18.5 LF-MCG/0.5 IM SUSY: 0.5000 mL | PREFILLED_SYRINGE | Freq: Once | INTRAMUSCULAR | Status: DC

## 2022-08-27 MED ORDER — LACTATED RINGERS IV SOLN
500.0000 mL | INTRAVENOUS | Status: DC | PRN
  Administered 2022-08-27: 500 mL via INTRAVENOUS

## 2022-08-27 MED ORDER — OXYCODONE-ACETAMINOPHEN 5-325 MG PO TABS: 2.0000 | ORAL_TABLET | ORAL | Status: DC | PRN

## 2022-08-27 MED ORDER — SIMETHICONE 80 MG PO CHEW: 80.0000 mg | CHEWABLE_TABLET | ORAL | Status: DC | PRN

## 2022-08-27 MED ORDER — OXYCODONE-ACETAMINOPHEN 5-325 MG PO TABS: 1.0000 | ORAL_TABLET | ORAL | Status: DC | PRN

## 2022-08-27 MED ORDER — ONDANSETRON HCL 4 MG/2ML IJ SOLN
4.0000 mg | Freq: Four times a day (QID) | INTRAMUSCULAR | Status: DC | PRN
  Administered 2022-08-27: 4 mg via INTRAVENOUS
  Filled 2022-08-27: qty 2

## 2022-08-27 MED ORDER — LACTATED RINGERS IV SOLN: INTRAVENOUS | Status: DC

## 2022-08-27 MED ORDER — ACETAMINOPHEN 325 MG PO TABS: 650.0000 mg | ORAL_TABLET | ORAL | Status: DC | PRN

## 2022-08-27 MED ORDER — PRENATAL MULTIVITAMIN CH
1.0000 | ORAL_TABLET | Freq: Every day | ORAL | Status: DC
  Administered 2022-08-27 – 2022-08-28 (×2): 1 via ORAL
  Filled 2022-08-27 (×2): qty 1

## 2022-08-27 MED ORDER — ONDANSETRON HCL 4 MG/2ML IJ SOLN: 4.0000 mg | INTRAMUSCULAR | Status: DC | PRN

## 2022-08-27 MED ORDER — BENZOCAINE-MENTHOL 20-0.5 % EX AERO: 1.0000 | INHALATION_SPRAY | CUTANEOUS | Status: DC | PRN

## 2022-08-27 MED ORDER — SOD CITRATE-CITRIC ACID 500-334 MG/5ML PO SOLN: 30.0000 mL | ORAL | Status: DC | PRN

## 2022-08-27 MED ORDER — ACETAMINOPHEN 325 MG PO TABS
650.0000 mg | ORAL_TABLET | ORAL | Status: DC | PRN
  Administered 2022-08-27 – 2022-08-28 (×4): 650 mg via ORAL
  Filled 2022-08-27 (×3): qty 2

## 2022-08-27 MED ORDER — DIBUCAINE (PERIANAL) 1 % EX OINT: 1.0000 | TOPICAL_OINTMENT | CUTANEOUS | Status: DC | PRN

## 2022-08-27 MED ORDER — DIPHENHYDRAMINE HCL 25 MG PO CAPS: 25.0000 mg | ORAL_CAPSULE | Freq: Four times a day (QID) | ORAL | Status: DC | PRN

## 2022-08-27 MED ORDER — IBUPROFEN 600 MG PO TABS
600.0000 mg | ORAL_TABLET | Freq: Four times a day (QID) | ORAL | Status: DC
  Administered 2022-08-27 – 2022-08-28 (×6): 600 mg via ORAL
  Filled 2022-08-27 (×6): qty 1

## 2022-08-27 MED ORDER — LIDOCAINE HCL (PF) 1 % IJ SOLN: 30.0000 mL | INTRAMUSCULAR | Status: DC | PRN

## 2022-08-27 MED ORDER — SENNOSIDES-DOCUSATE SODIUM 8.6-50 MG PO TABS
2.0000 | ORAL_TABLET | ORAL | Status: DC
  Administered 2022-08-27: 2 via ORAL
  Filled 2022-08-27 (×2): qty 2

## 2022-08-27 MED ORDER — ZOLPIDEM TARTRATE 5 MG PO TABS: 5.0000 mg | ORAL_TABLET | Freq: Every evening | ORAL | Status: DC | PRN

## 2022-08-27 MED ORDER — OXYTOCIN BOLUS FROM INFUSION
333.0000 mL | Freq: Once | INTRAVENOUS | Status: AC
  Administered 2022-08-27: 333 mL via INTRAVENOUS

## 2022-08-27 NOTE — Discharge Summary (Signed)
Postpartum Discharge Summary  Date of Service updated***     Patient Name: Carolyn Carroll DOB: [birth year] MRN: [id number]  Date of admission: 08/27/2022 Delivery date:08/27/2022  Delivering provider: Cam Hai D  Date of discharge: 08/27/2022  Admitting diagnosis: Labor and delivery, indication for care [O75.9] Intrauterine pregnancy: [redacted]w[redacted]d     Secondary diagnosis:  Principal Problem:   Labor and delivery, indication for care Active Problems:   Language barrier   History of preterm delivery   Short interval between pregnancies affecting pregnancy in third trimester, antepartum   Late prenatal care   Small for gestational age  Additional problems: none    Discharge diagnosis: Term Pregnancy Delivered                                              Post partum procedures: none Augmentation:  none Complications: None  Hospital course: Onset of Labor With Vaginal Delivery      27 y.o. yo [id number] at [redacted]w[redacted]d was admitted in Active Labor on 08/27/2022. Labor course was uncomplicated, however infant noted to be SGA based on weight of 2330gm.  Membrane Rupture Time/Date: 5:54 AM ,08/27/2022   Delivery Method:Vaginal, Spontaneous  Episiotomy: None  Lacerations:  None  Patient had a postpartum course complicated by ***.  She is ambulating, tolerating a regular diet, passing flatus, and urinating well. Patient is discharged home in stable condition on 08/27/22.  Newborn Data: Birth date:08/27/2022  Birth time:6:18 AM  Gender:Female  Living status:Living  Apgars:9 ,9  Weight:2330 g (5lb 2.2oz)  Magnesium Sulfate received: No BMZ received: No Rhophylac:N/A MMR:N/A T-DaP: unsure if given prenatally Flu: No Transfusion:No  Physical exam  Vitals:   08/27/22 0700 08/27/22 0715 08/27/22 0730 08/27/22 0745  BP: 113/62 107/70 114/68 (!) 106/56  Pulse: 65 61 60 (!) 59  Resp: 17 17  17   Temp:    98.3 F (36.8 C)  TempSrc:    Oral  Weight:       General: {Exam;  general:21111117} Lochia: {Desc; appropriate/inappropriate:30686::"appropriate"} Uterine Fundus: {Desc; firm/soft:30687} Incision: {Exam; incision:21111123} DVT Evaluation: {Exam; dvt:2111122} Labs: Lab Results  Component Value Date   WBC 8.6 08/27/2022   HGB 12.8 08/27/2022   HCT 39.7 08/27/2022   MCV 76.9 (L) 08/27/2022   PLT 219 08/27/2022      Latest Ref Rng & Units 03/09/2021    2:23 AM  CMP  Glucose 70 - 99 mg/dL 98   BUN 6 - 20 mg/dL 9   Creatinine 1.47 - 8.29 mg/dL 5.62   Sodium 130 - 865 mmol/L 134   Potassium 3.5 - 5.1 mmol/L 3.5   Chloride 98 - 111 mmol/L 104   CO2 22 - 32 mmol/L 22   Calcium 8.9 - 10.3 mg/dL 8.5   Total Protein 6.5 - 8.1 g/dL 6.0   Total Bilirubin 0.3 - 1.2 mg/dL 0.3   Alkaline Phos 38 - 126 U/L 54   AST 15 - 41 U/L 14   ALT 0 - 44 U/L 17    Edinburgh Score:    07/04/2021   11:03 AM  Edinburgh Postnatal Depression Scale Screening Tool  I have been able to laugh and see the funny side of things. 0  I have looked forward with enjoyment to things. 0  I have blamed myself unnecessarily when things went wrong. 0  I have been anxious  or worried for no good reason. 0  I have felt scared or panicky for no good reason. 0  Things have been getting on top of me. 0  I have been so unhappy that I have had difficulty sleeping. 0  I have felt sad or miserable. 0  I have been so unhappy that I have been crying. 0  The thought of harming myself has occurred to me. 0  Edinburgh Postnatal Depression Scale Total 0     After visit meds:  Allergies as of 08/27/2022   No Known Allergies   Med Rec must be completed prior to using this [facility]***        Discharge home in stable condition Infant Feeding: {Baby feeding:23562} Infant Disposition:{CHL IP OB HOME WITH ZOXWRU:04540} Discharge instruction: per After Visit Summary and Postpartum booklet. Activity: Advance as tolerated. Pelvic rest for 6 weeks.  Diet: routine diet Future  Appointments: Future Appointments  Date Time Provider Department Center  08/30/2022  8:35 AM [place] Bing, MD [facility]   Follow up Visit:  [name], CNM  P [facility] Admin Pool Please schedule this patient for Postpartum visit in: 6 weeks with the following provider: Any provider In-Person For C/S patients schedule nurse incision check in weeks 2 weeks: no Low risk pregnancy complicated by: hx PTD after a fall Delivery mode:  SVD Anticipated Birth Control:  Nexplanon at [facility] PP Procedures needed: none Schedule Integrated BH visit: no   08/27/2022 [name], CNM

## 2022-08-27 NOTE — MAU Note (Signed)
.  Carolyn Carroll is a 27 y.o. at [redacted]w[redacted]d here in MAU reporting: ctx  since 5pm . Now about q 15. Reports some mucous discharge around 4:30pm. Good fetal movent felt.  On Monday 1 cm  LMP:  Onset of complaint: 5pm Pain score: 8 Vitals:   08/27/22 0228  BP: 117/81  Pulse: 67  Resp: 18  Temp: 97.8 F (36.6 C)     FHT:135 Lab orders placed from triage:  labor eval

## 2022-08-27 NOTE — H&P (Signed)
Carolyn Carroll is a 27 y.o. [phone] female at 108w4d by LMP presenting for SOL.   Reports active fetal movement, contractions: regular, every 2 minutes, vaginal bleeding: scant staining, membranes: intact.  Initiated prenatal care at [facility] at 27 wks.   Most recent u/s: (anatomy scan while at [facility])   This pregnancy complicated by: # lang barrier # hx of PTD in 2019 due to a fall # late to care (27wks) # short preg interval (del April 2023)  Prenatal History/Complications:  # SVB x 3 (1 preterm, 2 term)  Past Medical History: Past Medical History:  Diagnosis Date   Medical history non-contributory     Past Surgical History: Past Surgical History:  Procedure Laterality Date   NO PAST SURGERIES      Obstetrical History: OB History     Gravida  4   Para  3   Term  3   Preterm      AB      Living  3      SAB      IAB      Ectopic      Multiple  0   Live Births  3           Social History: Social History   Socioeconomic History   Marital status: Single    Spouse name: Not on file   Number of children: Not on file   Years of education: Not on file   Highest education level: Not on file  Occupational History   Not on file  Tobacco Use   Smoking status: Never   Smokeless tobacco: Never  Vaping Use   Vaping Use: Never used  Substance and Sexual Activity   Alcohol use: Not Currently   Drug use: Never   Sexual activity: Not Currently    Birth control/protection: None  Other Topics Concern   Not on file  Social History Narrative   Not on file   Social Determinants of Health   Financial Resource Strain: Not on file  Food Insecurity: Food Insecurity Present (04/19/2021)   Hunger Vital Sign    Worried About Running Out of Food in the Last Year: Sometimes true    Ran Out of Food in the Last Year: Sometimes true  Transportation Needs: No Transportation Needs (04/19/2021)   PRAPARE - Administrator, Civil Service  (Medical): No    Lack of Transportation (Non-Medical): No  Physical Activity: Not on file  Stress: Not on file  Social Connections: Not on file    Family History: Family History  Problem Relation Age of Onset   Anemia Mother    Diabetes Neg Hx    Hypertension Neg Hx     Allergies: No Known Allergies  Medications Prior to Admission  Medication Sig Dispense Refill Last Dose   Prenatal Vit-Fe Fumarate-FA (PRENATAL MULTIVITAMIN) TABS tablet Take 1 tablet by mouth daily at 12 noon.   08/26/2022   acetaminophen (TYLENOL) 500 MG tablet Take 2 tablets (1,000 mg total) by mouth every 8 (eight) hours as needed (pain). 60 tablet 0     Review of Systems  Pertinent pos/neg as indicated in HPI  Blood pressure 130/68, pulse (!) 58, temperature 97.8 F (36.6 C), resp. rate 17, weight 56.7 kg, last menstrual period 11/23/2021, unknown if currently breastfeeding. General appearance: alert, cooperative, and mild distress Lungs: clear to auscultation bilaterally Heart: regular rate and rhythm Abdomen: gravid, soft, non-tender, EFW by Leopold's approximately 6-7lbs Extremities: tr edema  Fetal monitoring: FHR: 120s bpm, variability: moderate,  Accelerations: Present,  decelerations:  Absent Uterine activity: Frequency: Every 2-3 minutes Dilation: 5 Effacement (%): 70 Station: -2 Exam by:: Felipa Furnace RN Presentation: cephalic   Prenatal labs: ABO, Rh:  O+ Antibody:  neg Rubella:  immune (04/19/21) RPR: Nonreactive (04/02 0000)  HBsAg:   pending HIV:   pending GBS: Negative/-- (06/03 1434)  1hr glucola: 92  Prenatal Transfer Tool  Maternal Diabetes: No Genetic Screening: Declined Maternal Ultrasounds/Referrals: Other: unable to locate anatomy scan Fetal Ultrasounds or other Referrals:  None Maternal Substance Abuse:  No Significant Maternal Medications:  None Significant Maternal Lab Results: Group B Strep negative  No results found for this or any previous visit (from the  past 24 hour(s)).   Assessment:  [redacted]w[redacted]d SIUP  G4P3003  SOL  Cat 1 FHR  GBS Negative/-- (06/03 1434)  Plan:  Admit to L&D  IV pain meds/epidural prn active labor  Expectant management  Anticipate vag delivery   Plans to breastfeed  Contraception: Nexplanon @ [facility]  Circumcision: no  [name] CNM 08/27/2022, 4:44 AM

## 2022-08-27 NOTE — Lactation Note (Signed)
This note was copied from a baby's chart. Lactation Consultation Note  Patient Name: Boy Alethia Melendrez [id number] Date: 08/27/2022 Age:28 hours Reason for consult: Initial assessment;Infant < 6lbs  Spanish interpreter for Spanish via video.   P2, Mother states baby recently breastfed for 10 min and was supplemented with 5 ml of formula.  Feed on demand with cues.  Goal 8-12+ times per day after first 24 hrs. Wake baby after 3 hrs if needed.  Reviewed supplementation guidelines.  Mother falling asleep during consult.  Lactation to follow up later. Placed baby in crib.   Maternal Data Has patient been taught Hand Expression?: Yes Does the patient have breastfeeding experience prior to this delivery?: Yes How long did the patient breastfeed?: one year  Feeding Mother's Current Feeding Choice: Breast Milk and Formula Nipple Type: Extra Slow Flow  Interventions Interventions: Breast feeding basics reviewed;Education Consult Status Consult Status: Follow-up Date: 08/27/22 Follow-up type: In-patient    Dahlia Byes [facility] 08/27/2022, 10:58 AM

## 2022-08-27 NOTE — Plan of Care (Signed)

## 2022-08-28 ENCOUNTER — Ambulatory Visit (HOSPITAL_COMMUNITY): Payer: Self-pay

## 2022-08-28 ENCOUNTER — Other Ambulatory Visit (HOSPITAL_COMMUNITY): Payer: Self-pay

## 2022-08-28 MED ORDER — SENNOSIDES-DOCUSATE SODIUM 8.6-50 MG PO TABS
2.0000 | ORAL_TABLET | ORAL | 0 refills | Status: DC
  Filled 2022-08-28: qty 30, 15d supply, fill #0

## 2022-08-28 MED ORDER — IBUPROFEN 600 MG PO TABS
600.0000 mg | ORAL_TABLET | Freq: Four times a day (QID) | ORAL | 0 refills | Status: DC
  Filled 2022-08-28: qty 30, 8d supply, fill #0

## 2022-08-28 MED ORDER — BENZOCAINE-MENTHOL 20-0.5 % EX AERO
1.0000 | INHALATION_SPRAY | CUTANEOUS | 0 refills | Status: DC | PRN
  Filled 2022-08-28: qty 78, fill #0

## 2022-08-28 MED ORDER — ACETAMINOPHEN 325 MG PO TABS
650.0000 mg | ORAL_TABLET | ORAL | 0 refills | Status: AC | PRN
  Filled 2022-08-28: qty 100, 9d supply, fill #0

## 2022-08-28 NOTE — Lactation Note (Signed)
This note was copied from a baby's chart. Lactation Consultation Note  Patient Name: Carolyn Carroll [id number] Date: 08/28/2022 Age:28 hours  Reason for consult: Follow-up assessment;Infant < 6lbs  P4, [redacted]w[redacted]d, 5 lbs 0 oz, 2% weight loss  [employer], [place] #[id number], used for education and translation. Mother states baby is breastfeeding well. She is receptive to start pumping due to infant's weight, stamina and stimulation of milk production. Parents are supplementing baby with formula following breastfeeding. Mother advised to give baby her expressed milk prior to formula.   Discussed pumping, frequency, cleaning and storage. Parents verbalized understanding.     Feeding Mother's Current Feeding Choice: Breast Milk and Formula   Lactation Tools Discussed/Used Tools: Pump;Flanges Flange Size: 21 Breast pump type: Double-Electric Breast Pump;Manual Pump Education: Setup, frequency, and cleaning;Milk Storage Reason for Pumping: less than 6 lbs Pumping frequency: 3-4 times per day  Interventions  Education  Consult Status Consult Status: Follow-up Date: 08/29/22 Follow-up type: In-patient   Christella Hartigan M 08/28/2022, 5:38 PM

## 2022-08-30 ENCOUNTER — Encounter: Payer: Self-pay | Admitting: Obstetrics and Gynecology

## 2022-09-19 ENCOUNTER — Telehealth (HOSPITAL_COMMUNITY): Payer: Self-pay

## 2022-09-19 NOTE — Telephone Encounter (Signed)
09/19/2022 1559  Name: Carolyn Carroll MRN: [id number] DOB: May 16, 1995  Reason for Call:  Transition of Care Hospital Discharge Call  Contact Status: Patient Contact Status: Unable to contact Phone line says the person you are calling cannot except calls at this time. Attempted to call both phone numbers listed for patient.   Language assistant needed: Interpreter Mode: Telephonic Interpreter Interpreter Name: [phone] Interpreter Phone Number - If applicable: [phone]        Follow-Up Questions:    Inocente Salles Postnatal Depression Scale:  In the Past 7 Days:    PHQ2-9 Depression Scale:     Discharge Follow-up:    Post-discharge interventions: NA  Signature  Signe Colt

## 2022-10-02 ENCOUNTER — Ambulatory Visit: Payer: Self-pay | Admitting: Advanced Practice Midwife

## 2022-10-30 IMAGING — US US MFM OB DETAIL+14 WK
1 series · 13 of 28 positions shown · non-contrast
Comparison: none

[Series 1: us mfm ob detail+14 wk · 13 of 104 slices shown]
[im 4/104]
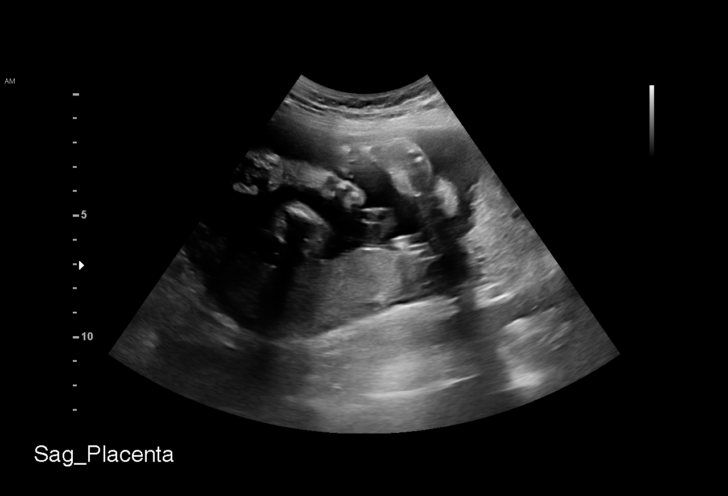
[im 12/104]
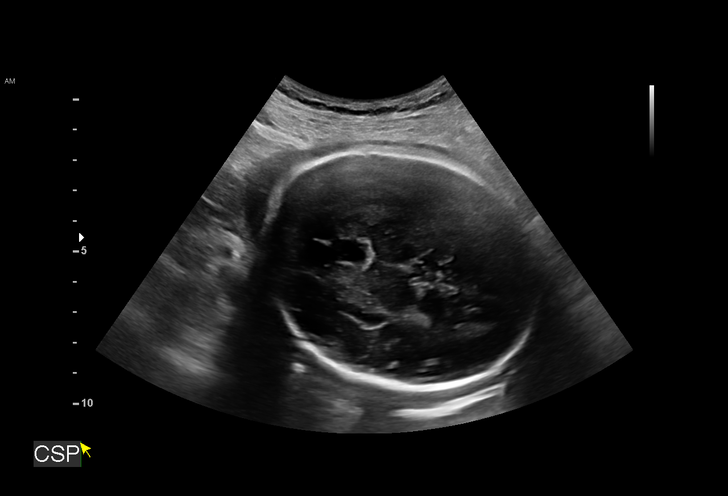
[im 20/104]
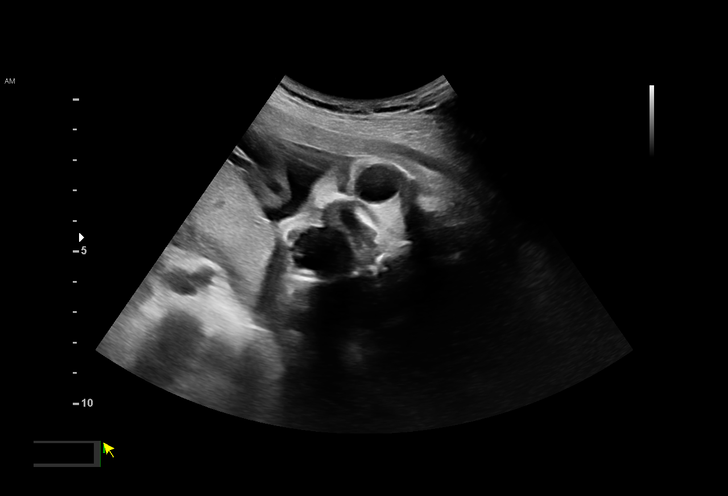
[im 27/104]
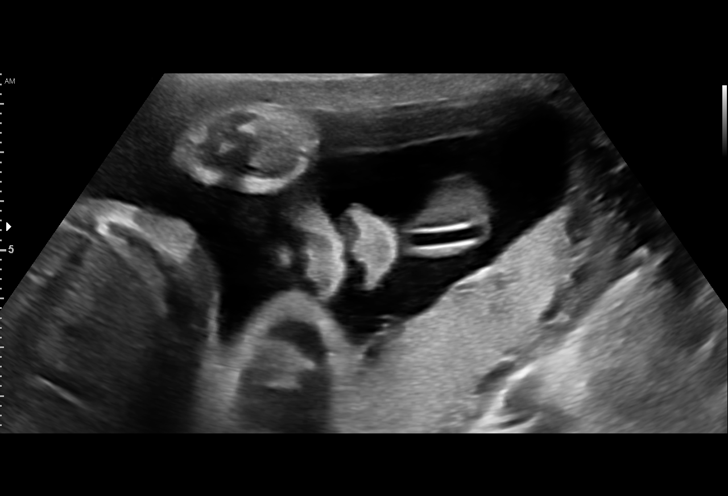
[im 35/104]
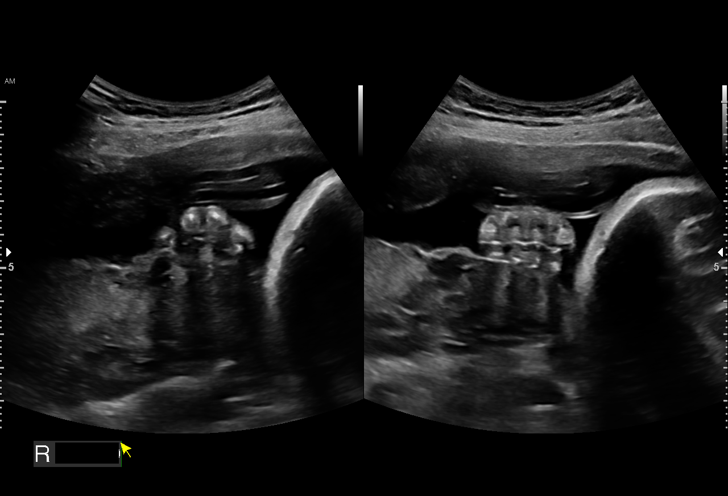
[im 42/104]
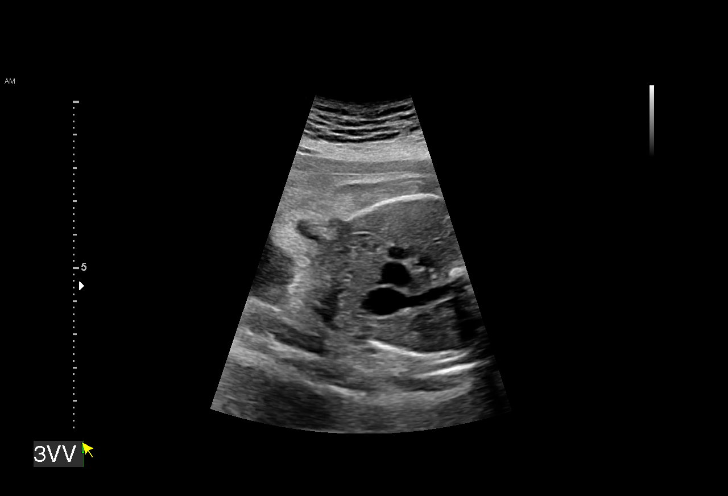
[im 54/104]
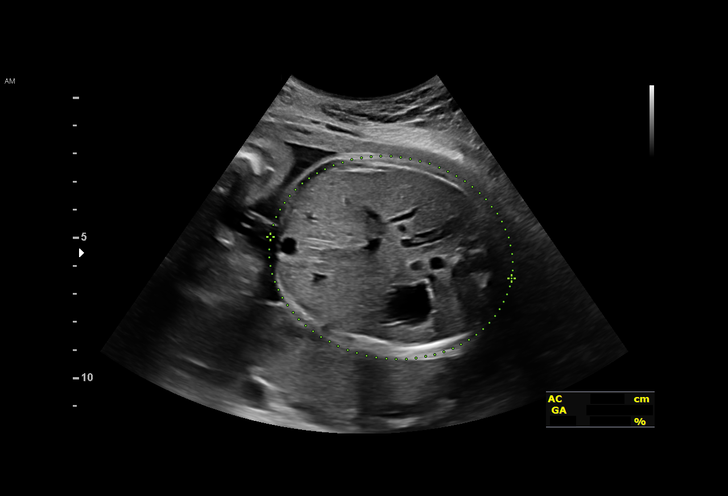
[im 62/104]
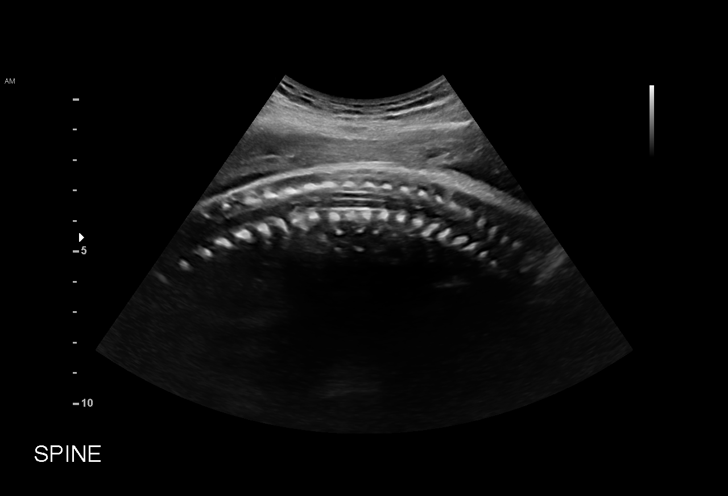
[im 69/104]
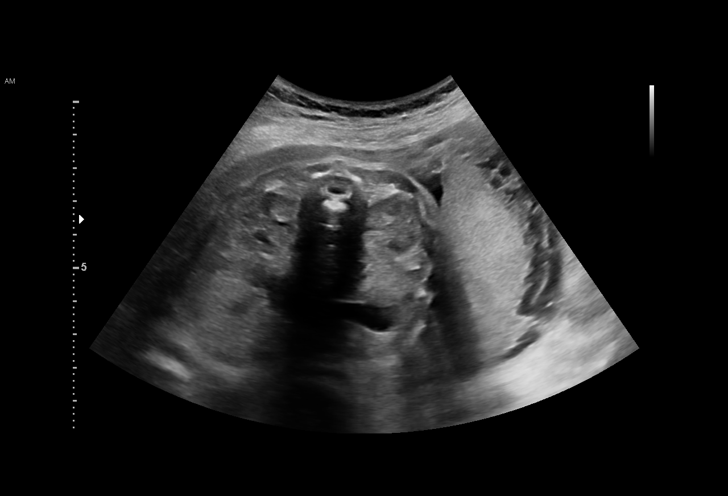
[im 77/104]
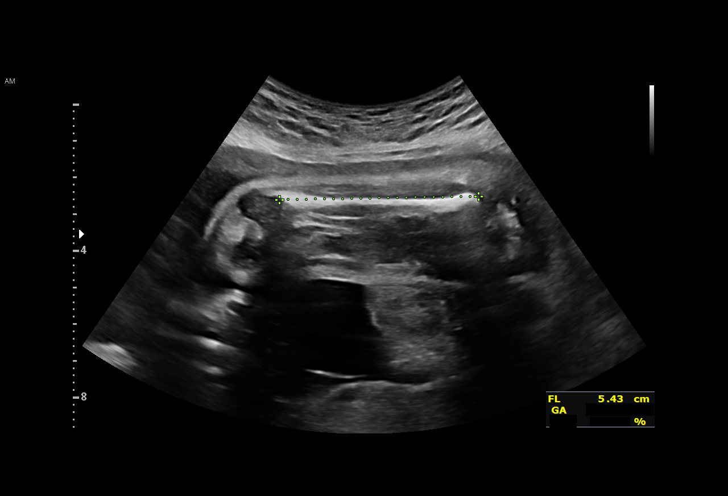
[im 84/104]
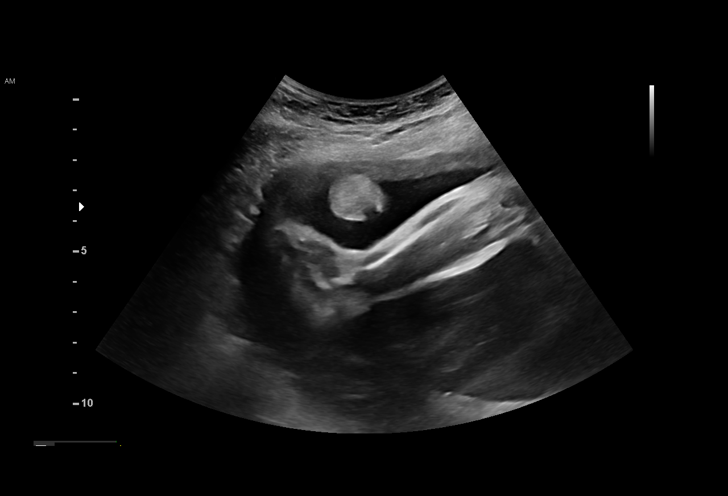
[im 92/104]
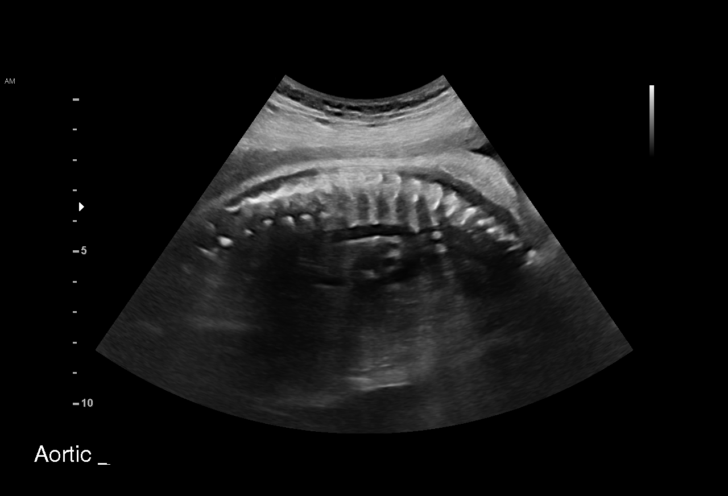
[im 100/104]
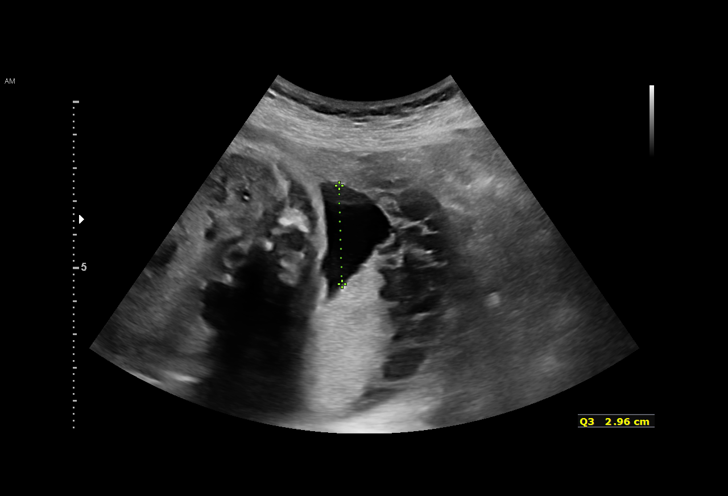

[13 of 28 positions shown; findings below may reference images not displayed]

[REDACTED]

Indications

 29 weeks gestation of pregnancy
 Late to prenatal care, third trimester
 Antenatal screening for malformations
Fetal Evaluation

 Num Of Fetuses:         1
 Fetal Heart Rate(bpm):  148
 Cardiac Activity:       Observed
 Presentation:           Cephalic
 Placenta:               Posterior
 P. Cord Insertion:      Visualized

 Amniotic Fluid
 AFI FV:      Within normal limits

 AFI Sum(cm)     %Tile       Largest Pocket(cm)
 13.7            43

 RUQ(cm)       RLQ(cm)       LUQ(cm)        LLQ(cm)
 3.6           3.4           3.8            3
Biometry

 BPD:      72.1  mm     G. Age:  29w 0d         27  %    CI:        69.66   %    70 - 86
                                                         FL/HC:      19.5   %    19.6 -
 HC:      275.7  mm     G. Age:  30w 1d         41  %    HC/AC:      1.10        0.99 -
 AC:      250.5  mm     G. Age:  29w 2d         43  %    FL/BPD:     74.5   %    71 - 87
 FL:       53.7  mm     G. Age:  28w 3d         15  %    FL/AC:      21.4   %    20 - 24
 CER:      37.9  mm     G. Age:  31w 1d         91  %

 LV:          3  mm
 CM:        8.6  mm

 Est. FW:    0351  gm    2 lb 15 oz      29  %
OB History

 Gravidity:    3         Term:   2        Prem:   0        SAB:   0
 TOP:          0       Ectopic:  0        Living: 2
Gestational Age

 LMP:           25w 4d        Date:  11/06/20                 EDD:   08/13/21
 U/S Today:     29w 2d                                        EDD:   07/18/21
 Best:          29w 2d     Det. By:  U/S (05/04/21)           EDD:   07/18/21
Anatomy

 Cranium:               Appears normal         LVOT:                   Appears normal
 Cavum:                 Appears normal         Aortic Arch:            Appears normal
 Ventricles:            Appears normal         Ductal Arch:            Appears normal
 Choroid Plexus:        Appears normal         Diaphragm:              Appears normal
 Cerebellum:            Appears normal         Stomach:                Appears normal, left
                                                                       sided
 Posterior Fossa:       Appears normal         Abdomen:                Appears normal
 Nuchal Fold:           Appears normal         Abdominal Wall:         Appears nml (cord
                                                                       insert, abd wall)
 Face:                  Appears normal         Cord Vessels:           Appears normal (3
                        (orbits and profile)                           vessel cord)
 Lips:                  Appears normal         Kidneys:                Appear normal
 Palate:                Appears normal         Bladder:                Appears normal
 Thoracic:              Appears normal         Spine:                  Appears normal
 Heart:                 Appears normal         Upper Extremities:      Appears normal
                        (4CH, axis, and
                        situs)
 RVOT:                  Appears normal         Lower Extremities:      Appears normal

 Other:  VC, 3VV and 3VTV visualized. Heels/feet and open hands/5th digits
         visualized. Nasal bone, lenses, maxilla, mandible and falx visualized.
Cervix Uterus Adnexa

 Cervix
 Not visualized (advanced GA >99wks)

 Uterus
 No abnormality visualized.

 Right Ovary
 Within normal limits.

 Left Ovary
 Not visualized.
Impression

 Single intrauterine pregnancy here for a detailed anatomy
 due to uncertain LMP.
 Normal anatomy with measurements were inconsistent with
 dates by ^4 weeks, therefore we have adjusted the dates to
 today's examination.
 There is good fetal movement and amniotic fluid volume

 New EDD is 07/18/21

 I explained today's visit with Ms. UNN IREN. She is scheduled to
 return in 4 weeks.
Recommendations

 Follow up growth in 4 weeks

## 2022-12-24 IMAGING — US US MFM OB FOLLOW-UP
1 series · 15 of 23 positions shown · non-contrast
Comparison: none

[Series 1: us mfm ob follow-up · 23 acquisitions, 15 frames shown]
[im 1/23]
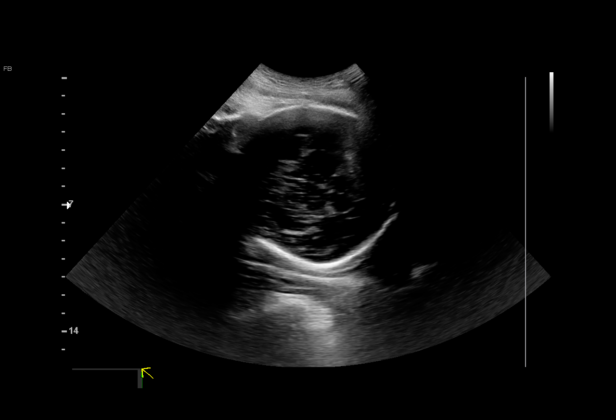
[im 3/23]
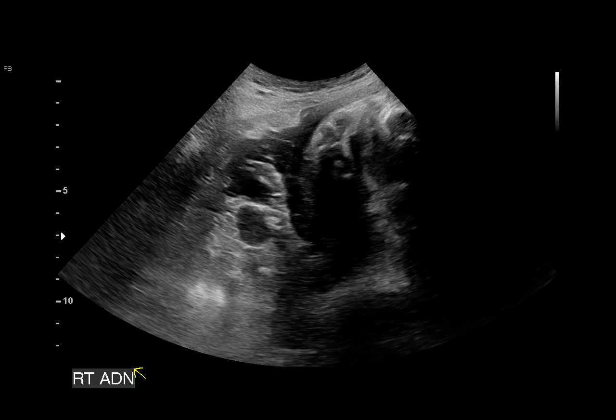
[im 4/23]
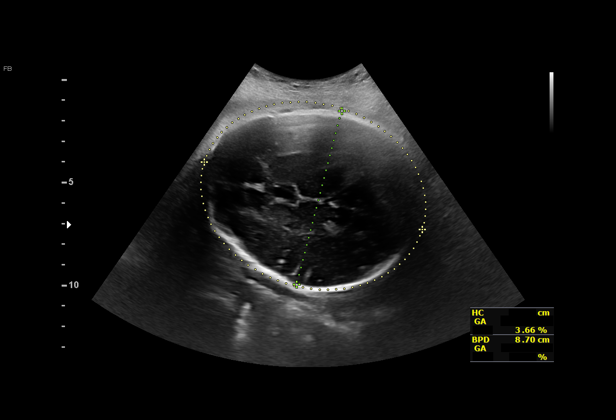
[im 6/23]
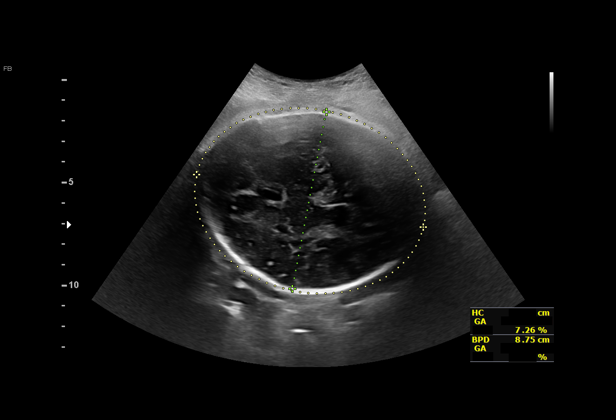
[im 7/23]
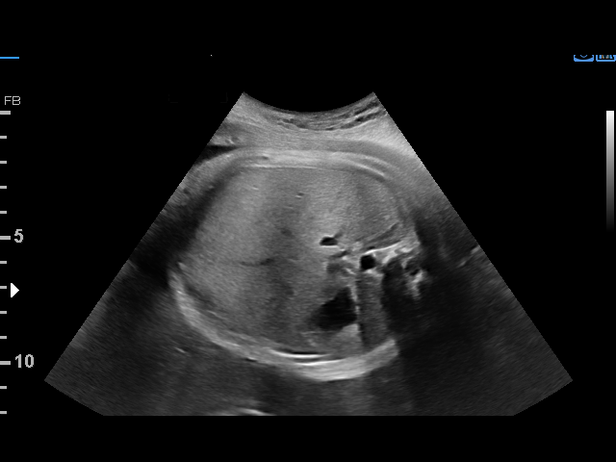
[im 9/23]
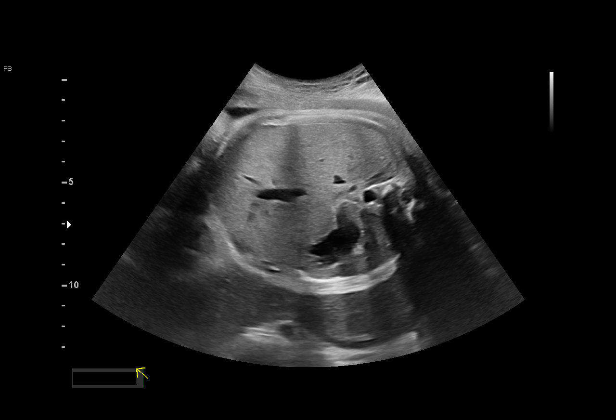
[im 10/23]
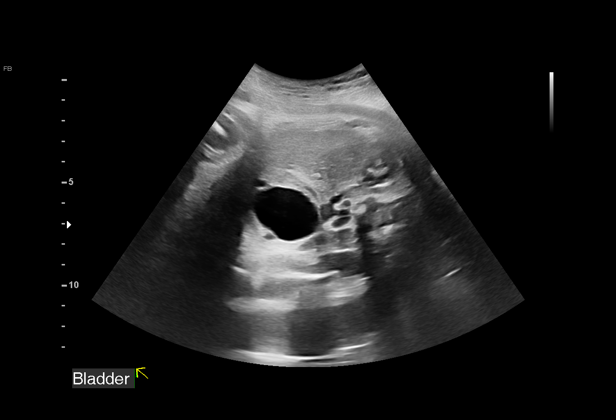
[im 12/23]
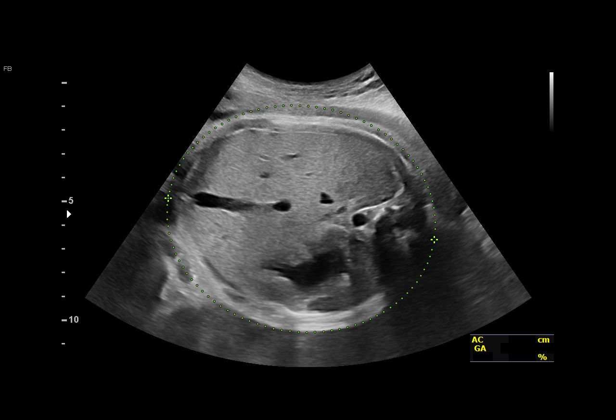
[im 14/23]
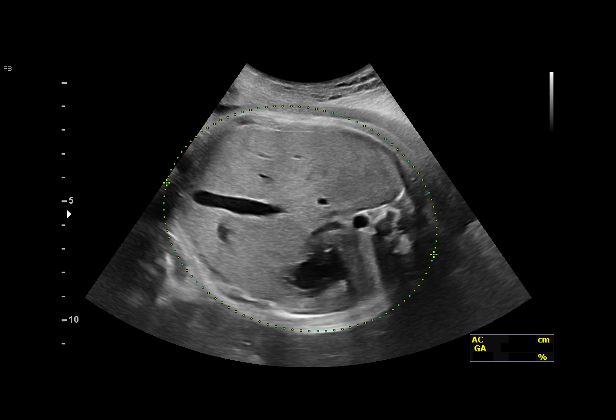
[im 15/23]
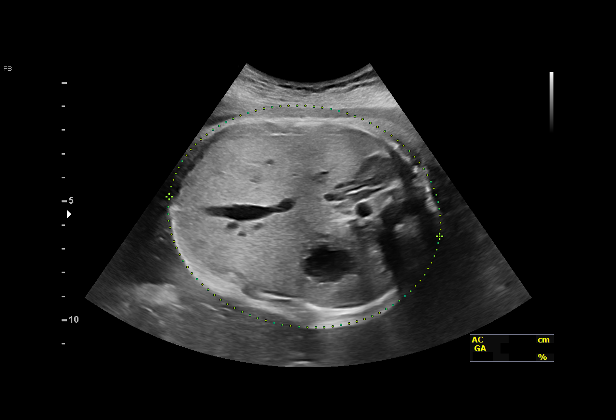
[im 17/23]
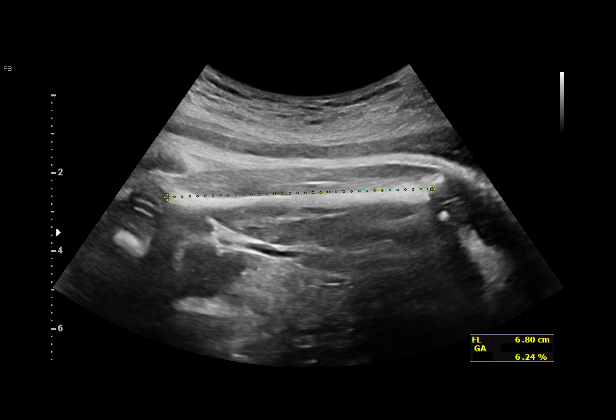
[im 18/23]
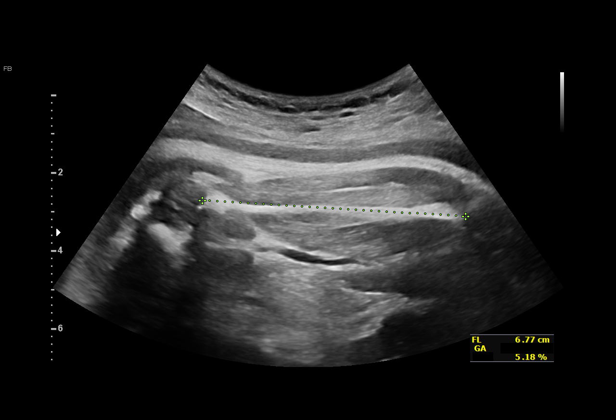
[im 20/23]
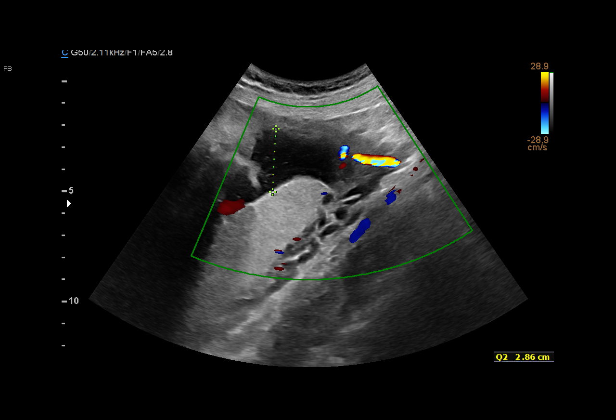
[im 21/23]
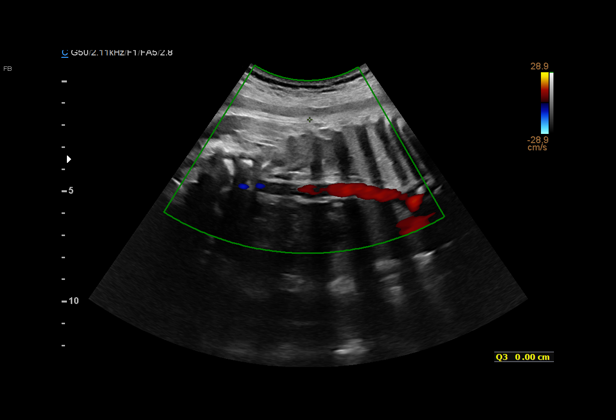
[im 23/23]
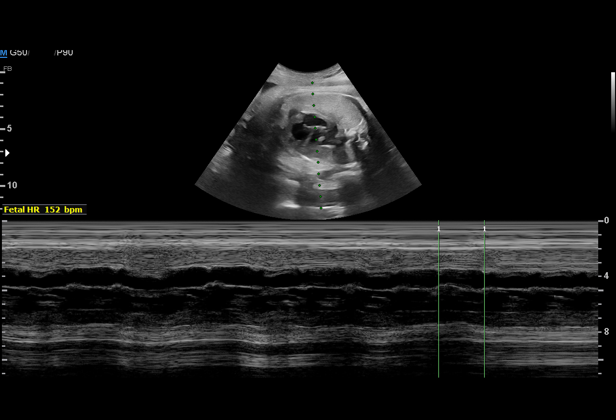

[15 of 23 positions shown; findings below may reference images not displayed]

Addendum:\.Don Elvis

             [REDACTED]

Indications

 Late to prenatal care, third trimester
 37 weeks gestation of pregnancy
 LR NIPS
 Horizon-Neg
Fetal Evaluation

 Num Of Fetuses:         1
 Fetal Heart Rate(bpm):  152
 Cardiac Activity:       Observed
 Presentation:           Cephalic
 Placenta:               Posterior
 P. Cord Insertion:      Previously Visualized

 Amniotic Fluid
 AFI FV:      Subjectively decreased

 AFI Sum(cm)     %Tile       Largest Pocket(cm)
 6.1             < 3

 RUQ(cm)       RLQ(cm)       LUQ(cm)        LLQ(cm)
 1.5           1.7           2.9            0
Biophysical Evaluation

 Amniotic F.V:   Pocket => 2 cm             F. Tone:        Observed
 F. Movement:    Observed                   Score:          [DATE]
 F. Breathing:   Observed
Biometry

 BPD:      87.8  mm     G. Age:  35w 3d         21  %    CI:         77.2   %    70 - 86
                                                         FL/HC:      21.7   %    20.8 -
 HC:      316.4  mm     G. Age:  35w 4d        3.8  %    HC/AC:      0.96        0.92 -
 AC:       329   mm     G. Age:  36w 6d         55  %    FL/BPD:     78.4   %    71 - 87
 FL:       68.8  mm     G. Age:  35w 2d         10  %    FL/AC:      20.9   %    20 - 24

 Est. FW:    0010  gm      6 lb 5 oz     31  %
OB History

 Gravidity:    3         Term:   2        Prem:   0        SAB:   0
 TOP:          0       Ectopic:  0        Living: 2
Gestational Age

 LMP:           33w 3d        Date:  11/06/20                   EDD:   08/13/21
 U/S Today:     35w 6d                                        EDD:   07/27/21
 Best:          37w 1d     Det. By:  U/S  (05/04/21)          EDD:   07/18/21
Anatomy

 Cranium:               Appears normal         LVOT:                   Previously seen
 Cavum:                 Appears normal         Aortic Arch:            Previously seen
 Ventricles:            Previously seen        Ductal Arch:            Previously seen
 Choroid Plexus:        Previously seen        Diaphragm:              Appears normal
 Cerebellum:            Previously seen        Stomach:                Appears normal, left
                                                                       sided
 Posterior Fossa:       Previously seen        Abdomen:                Previously seen
 Nuchal Fold:           Previously seen        Abdominal Wall:         Previously seen
 Face:                  Orbits and profile     Cord Vessels:           Previously seen
                        previously seen
 Lips:                  Appears normal         Kidneys:                Appear normal
 Palate:                Previously seen        Bladder:                Appears normal
 Thoracic:              Appears normal         Spine:                  Previously seen
 Heart:                 Previously seen        Upper Extremities:      Previously seen
 RVOT:                  Previously seen        Lower Extremities:      Previously seen

 Other:  VC, 3VV and 3VTV, Heels/feet and open hands/5th digits, as well as,
         Nasal bone, lenses, maxilla, mandible and falx all previously
         visualized. Male gender previously seen.
Cervix Uterus Adnexa

 Adnexa
 No abnormality visualized.
Impression
 Patient returned for fetal growth assessment.  She has not
 had screening for gestational diabetes.
 She had missed her prenatal visit appointments.

 Amniotic fluid is decreased for this gestational age (no
 oligohydramnios).  Fetal growth is appropriate for gestational
 age.  Cephalic presentation.  Antenatal testing is reassuring.
 BPP [DATE].

 I obtained a history and counseled her with help of Spanish
 language interpreter present in the room.  Patient reports she
 is mucoid vaginal discharge but no leakage of amniotic fluid.

 I encouraged her to make a prenatal visit appointment to
 discuss delivery options.

 Addendum: Given that she has low amniotic fluid and
 possible leakage of amniotic fluid, it is reasonable to consider
 delivery at 38 weeks' gestation.
Recommendations

 Follow-up scans as clinically indicated.
                     FADOUL

*** End of Addendum ***\.Chic, Robinson Antonio

             [REDACTED]

Indications

 Late to prenatal care, third trimester
 37 weeks gestation of pregnancy
 LR NIPS
 Horizon-Neg
Fetal Evaluation

 Num Of Fetuses:         1
 Fetal Heart Rate(bpm):  152
 Cardiac Activity:       Observed
 Presentation:           Cephalic
 Placenta:               Posterior
 P. Cord Insertion:      Previously Visualized

 Amniotic Fluid
 AFI FV:      Subjectively decreased

 AFI Sum(cm)     %Tile       Largest Pocket(cm)
 6.1             < 3

 RUQ(cm)       RLQ(cm)       LUQ(cm)        LLQ(cm)
 1.5           1.7           2.9            0
Biophysical Evaluation

 Amniotic F.V:   Pocket => 2 cm             F. Tone:        Observed
 F. Movement:    Observed                   Score:          [DATE]
 F. Breathing:   Observed
Biometry

 BPD:      87.8  mm     G. Age:  35w 3d         21  %    CI:         77.2   %    70 - 86
                                                         FL/HC:      21.7   %    20.8 -
 HC:      316.4  mm     G. Age:  35w 4d        3.8  %    HC/AC:      0.96        0.92 -
 AC:       329   mm     G. Age:  36w 6d         55  %    FL/BPD:     78.4   %    71 - 87
 FL:       68.8  mm     G. Age:  35w 2d         10  %    FL/AC:      20.9   %    20 - 24

 Est. FW:    0010  gm      6 lb 5 oz     31  %
OB History

 Gravidity:    3         Term:   2        Prem:   0        SAB:   0
 TOP:          0       Ectopic:  0        Living: 2
Gestational Age

 LMP:           33w 3d        Date:  11/06/20                   EDD:   08/13/21
 U/S Today:     35w 6d                                        EDD:   07/27/21
 Best:          37w 1d     Det. By:  U/S  (05/04/21)          EDD:   07/18/21
Anatomy

 Cranium:               Appears normal         LVOT:                   Previously seen
 Cavum:                 Appears normal         Aortic Arch:            Previously seen
 Ventricles:            Previously seen        Ductal Arch:            Previously seen
 Choroid Plexus:        Previously seen        Diaphragm:              Appears normal
 Cerebellum:            Previously seen        Stomach:                Appears normal, left
                                                                       sided
 Posterior Fossa:       Previously seen        Abdomen:                Previously seen
 Nuchal Fold:           Previously seen        Abdominal Wall:         Previously seen
 Face:                  Orbits and profile     Cord Vessels:           Previously seen
                        previously seen
 Lips:                  Appears normal         Kidneys:                Appear normal
 Palate:                Previously seen        Bladder:                Appears normal
 Thoracic:              Appears normal         Spine:                  Previously seen
 Heart:                 Previously seen        Upper Extremities:      Previously seen
 RVOT:                  Previously seen        Lower Extremities:      Previously seen

 Other:  VC, 3VV and 3VTV, Heels/feet and open hands/5th digits, as well as,
         Nasal bone, lenses, maxilla, mandible and falx all previously
         visualized. Male gender previously seen.
Cervix Uterus Adnexa

 Adnexa
 No abnormality visualized.
Impression
 Patient returned for fetal growth assessment.  She has not
 had screening for gestational diabetes.
 She had missed her prenatal visit appointments.

 Amniotic fluid is decreased for this gestational age (no
 oligohydramnios).  Fetal growth is appropriate for gestational
 age.  Cephalic presentation.  Antenatal testing is reassuring.
 BPP [DATE].

 I obtained a history and counseled her with help of Spanish
 language interpreter present in the room.  Patient reports she
 is mucoid vaginal discharge but no leakage of amniotic fluid.

 I encouraged her to make a prenatal visit appointment to
 discuss delivery options.
Recommendations

 Follow-up scans as clinically indicated.
                Don Elvis

## 2023-03-07 NOTE — L&D Delivery Note (Signed)
 Delivery Note Carolyn Carroll is a 28 y.o. [phone] at [redacted]w[redacted]d admitted for active labor.   GBS Status:  PRESUMPTIVE NEGATIVE/-- (10/23 1847)  Labor course: Initial SVE: 10/100/-1. Augmentation with: none. She then progressed to complete.  ROM:  presumably 3 days ago (no SROM in delivery room, baby's hair seen w/pushing  Birth: After a brief 2nd stage, she delivered a Live born female  Birth Weight:   APGAR: 8, 9  Newborn Delivery   Birth date/time: 12/27/2024 23:18:09 Delivery type: Vaginal, Spontaneous        Delivered via spontaneous vaginal delivery (Presentation: OA ). Nuchal cord present: No. . Shoulders and body delivered in usual fashion. Infant placed directly on mom's abdomen for bonding/skin-to-skin, baby dried and stimulated. Cord clamped x 2 after 1 minute and cut by FOB.  Cord blood collected. Placenta delivered-Spontaneous with 3 vessels. 10u Pitocin  given IM prior to delivery of placenta.  Fundus firm with massage. Placenta inspected and appears to be intact with a 3 VC.  Sponge and instrument count were correct x2.  Intrapartum complications:  None Anesthesia:  none Lacerations:  none Suture Repair:  EBL (mL):80   Mom to postpartum.  Baby to Couplet care / Skin to Skin. Placenta to L&D   Plans to Breast and bottlefeed Contraception: Nexplanon Circumcision: N/A  Note sent to [facility]: [facility] for pp visit.  Delivery Report:  Review the Delivery Report for details.     Signed: Cathlean Ely, DNP,CNM 12/28/2023, 12:06 AM

## 2023-08-06 LAB — OB RESULTS CONSOLE GC/CHLAMYDIA
Chlamydia: NEGATIVE
Neisseria Gonorrhea: NEGATIVE

## 2023-08-06 LAB — OB RESULTS CONSOLE RPR: RPR: NONREACTIVE

## 2023-08-06 LAB — OB RESULTS CONSOLE PLATELET COUNT: Platelets: 211

## 2023-08-06 LAB — AFP, SERUM, OPEN SPINA BIFIDA: AFP, Serum: NEGATIVE

## 2023-08-06 LAB — HEPATITIS C ANTIBODY: HCV Ab: NEGATIVE

## 2023-08-06 LAB — OB RESULTS CONSOLE HGB/HCT, BLOOD
HCT: 38 (ref 29–41)
Hemoglobin: 13.3

## 2023-08-06 LAB — OB RESULTS CONSOLE HIV ANTIBODY (ROUTINE TESTING): HIV: NONREACTIVE

## 2023-08-06 LAB — GLUCOSE TOLERANCE, 1 HOUR: Glucose, 1 Hour GTT: 82

## 2023-08-07 ENCOUNTER — Other Ambulatory Visit: Payer: Self-pay | Admitting: Obstetrics & Gynecology

## 2023-08-07 DIAGNOSIS — Z36 Encounter for antenatal screening for chromosomal anomalies: Secondary | ICD-10-CM

## 2023-08-08 LAB — OB RESULTS CONSOLE HEPATITIS B SURFACE ANTIGEN: Hepatitis B Surface Ag: NEGATIVE

## 2023-08-08 LAB — HEPATITIS B SURFACE ANTIGEN
Hep B Core Total Ab: NONREACTIVE
Hep B S Ab: NONREACTIVE
Hepatitis B Surface Antigen: NONREACTIVE

## 2023-08-30 ENCOUNTER — Encounter: Payer: Self-pay | Admitting: Family Medicine

## 2023-09-03 ENCOUNTER — Encounter: Payer: Self-pay | Admitting: *Deleted

## 2023-09-03 DIAGNOSIS — Z8759 Personal history of other complications of pregnancy, childbirth and the puerperium: Secondary | ICD-10-CM | POA: Insufficient documentation

## 2023-09-05 ENCOUNTER — Inpatient Hospital Stay (HOSPITAL_COMMUNITY)
Admission: AD | Admit: 2023-09-05 | Discharge: 2023-09-05 | Disposition: A | Discharge status: Home / Self Care | Payer: Self-pay | Attending: Obstetrics and Gynecology | Admitting: Obstetrics and Gynecology

## 2023-09-05 ENCOUNTER — Encounter (HOSPITAL_COMMUNITY): Payer: Self-pay | Admitting: Obstetrics and Gynecology

## 2023-09-05 DIAGNOSIS — O99891 Other specified diseases and conditions complicating pregnancy: Secondary | ICD-10-CM

## 2023-09-05 DIAGNOSIS — R112 Nausea with vomiting, unspecified: Secondary | ICD-10-CM

## 2023-09-05 DIAGNOSIS — Z758 Other problems related to medical facilities and other health care: Secondary | ICD-10-CM

## 2023-09-05 DIAGNOSIS — R109 Unspecified abdominal pain: Secondary | ICD-10-CM | POA: Insufficient documentation

## 2023-09-05 DIAGNOSIS — M549 Dorsalgia, unspecified: Secondary | ICD-10-CM

## 2023-09-05 DIAGNOSIS — R519 Headache, unspecified: Secondary | ICD-10-CM

## 2023-09-05 DIAGNOSIS — O26892 Other specified pregnancy related conditions, second trimester: Secondary | ICD-10-CM

## 2023-09-05 DIAGNOSIS — Z3A21 21 weeks gestation of pregnancy: Secondary | ICD-10-CM | POA: Insufficient documentation

## 2023-09-05 DIAGNOSIS — Z603 Acculturation difficulty: Secondary | ICD-10-CM

## 2023-09-05 DIAGNOSIS — O212 Late vomiting of pregnancy: Secondary | ICD-10-CM | POA: Insufficient documentation

## 2023-09-05 DIAGNOSIS — Z8759 Personal history of other complications of pregnancy, childbirth and the puerperium: Secondary | ICD-10-CM | POA: Insufficient documentation

## 2023-09-05 DIAGNOSIS — R531 Weakness: Secondary | ICD-10-CM | POA: Insufficient documentation

## 2023-09-05 DIAGNOSIS — F4321 Adjustment disorder with depressed mood: Secondary | ICD-10-CM

## 2023-09-05 DIAGNOSIS — R42 Dizziness and giddiness: Secondary | ICD-10-CM | POA: Insufficient documentation

## 2023-09-05 LAB — URINALYSIS, ROUTINE W REFLEX MICROSCOPIC
Bilirubin Urine: NEGATIVE
Glucose, UA: NEGATIVE mg/dL
Hgb urine dipstick: NEGATIVE
Ketones, ur: NEGATIVE mg/dL
Leukocytes,Ua: NEGATIVE
Nitrite: NEGATIVE
Protein, ur: NEGATIVE mg/dL
Specific Gravity, Urine: 1.011 (ref 1.005–1.030)
pH: 8 (ref 5.0–8.0)

## 2023-09-05 LAB — CBC
HCT: 34 % — ABNORMAL LOW (ref 36.0–46.0)
Hemoglobin: 11.2 g/dL — ABNORMAL LOW (ref 12.0–15.0)
MCH: 26.6 pg (ref 26.0–34.0)
MCHC: 32.9 g/dL (ref 30.0–36.0)
MCV: 80.8 fL (ref 80.0–100.0)
Platelets: 209 10*3/uL (ref 150–400)
RBC: 4.21 MIL/uL (ref 3.87–5.11)
RDW: 13.2 % (ref 11.5–15.5)
WBC: 9.2 10*3/uL (ref 4.0–10.5)
nRBC: 0 % (ref 0.0–0.2)

## 2023-09-05 LAB — COMPREHENSIVE METABOLIC PANEL WITH GFR
ALT: 12 U/L (ref 0–44)
AST: 14 U/L — ABNORMAL LOW (ref 15–41)
Albumin: 2.8 g/dL — ABNORMAL LOW (ref 3.5–5.0)
Alkaline Phosphatase: 54 U/L (ref 38–126)
Anion gap: 10 (ref 5–15)
BUN: 8 mg/dL (ref 6–20)
CO2: 19 mmol/L — ABNORMAL LOW (ref 22–32)
Calcium: 8.5 mg/dL — ABNORMAL LOW (ref 8.9–10.3)
Chloride: 105 mmol/L (ref 98–111)
Creatinine, Ser: 0.59 mg/dL (ref 0.44–1.00)
GFR, Estimated: >60 mL/min (ref >60)
Glucose, Bld: 110 mg/dL — ABNORMAL HIGH (ref 70–99)
Potassium: 3.4 mmol/L — ABNORMAL LOW (ref 3.5–5.1)
Sodium: 134 mmol/L — ABNORMAL LOW (ref 135–145)
Total Bilirubin: 0.2 mg/dL (ref 0.0–1.2)
Total Protein: 6.1 g/dL — ABNORMAL LOW (ref 6.5–8.1)

## 2023-09-05 LAB — WET PREP, GENITAL
Clue Cells Wet Prep HPF POC: NONE SEEN
Sperm: NONE SEEN
Trich, Wet Prep: NONE SEEN
WBC, Wet Prep HPF POC: <10 (ref <10)
Yeast Wet Prep HPF POC: NONE SEEN

## 2023-09-05 MED ORDER — IBUPROFEN 600 MG PO TABS
600.0000 mg | ORAL_TABLET | Freq: Once | ORAL | Status: AC
  Administered 2023-09-05: 600 mg via ORAL
  Filled 2023-09-05: qty 1

## 2023-09-05 MED ORDER — CYCLOBENZAPRINE HCL 10 MG PO TABS: 10.0000 mg | ORAL_TABLET | Freq: Three times a day (TID) | ORAL | 0 refills | Status: AC | PRN

## 2023-09-05 MED ORDER — ACETAMINOPHEN 500 MG PO TABS
1000.0000 mg | ORAL_TABLET | Freq: Once | ORAL | Status: AC
  Administered 2023-09-05: 1000 mg via ORAL
  Filled 2023-09-05: qty 2

## 2023-09-05 MED ORDER — CYCLOBENZAPRINE HCL 5 MG PO TABS
10.0000 mg | ORAL_TABLET | Freq: Once | ORAL | Status: AC
  Administered 2023-09-05: 10 mg via ORAL
  Filled 2023-09-05: qty 2

## 2023-09-05 MED ORDER — SUCRALFATE 1 GM/10ML PO SUSP
1.0000 g | Freq: Once | ORAL | Status: DC
  Filled 2023-09-05: qty 10

## 2023-09-05 MED ORDER — ONDANSETRON 4 MG PO TBDP
4.0000 mg | ORAL_TABLET | Freq: Once | ORAL | Status: AC
  Administered 2023-09-05: 4 mg via ORAL
  Filled 2023-09-05: qty 1

## 2023-09-05 NOTE — MAU Provider Note (Cosign Needed)
 Chief Complaint: Abdominal Pain and Headache   Event Date/Time   First Provider Initiated Contact with Patient 09/05/23 1833      SUBJECTIVE HPI: Carolyn Carroll is a 28 y.o. [id number] at [redacted]w[redacted]d who presents to maternity admissions reporting abdominal pain, headache, weakness, dizziness and nausea/vomiting.  Her father passed away and the funeral was yesterday. She has not eaten or drank much in 2-3 days.  She reports feeling fetal movement.   She denies vaginal bleeding, vaginal itching/burning, urinary symptoms.  HPI  Past Medical History:  Diagnosis Date   Medical history non-contributory    Past Surgical History:  Procedure Laterality Date   NO PAST SURGERIES     Social History   Socioeconomic History   Marital status: Single    Spouse name: Not on file   Number of children: Not on file   Years of education: Not on file   Highest education level: Not on file  Occupational History   Not on file  Tobacco Use   Smoking status: Never   Smokeless tobacco: Never  Vaping Use   Vaping status: Never Used  Substance and Sexual Activity   Alcohol use: Not Currently   Drug use: Never   Sexual activity: Not Currently    Birth control/protection: None  Other Topics Concern   Not on file  Social History Narrative   Not on file   Social Drivers of Health   Financial Resource Strain: Not on file  Food Insecurity: No Food Insecurity (08/27/2022)   Hunger Vital Sign    Worried About Running Out of Food in the Last Year: Never true    Ran Out of Food in the Last Year: Never true  Transportation Needs: No Transportation Needs (08/27/2022)   PRAPARE - Administrator, Civil Service (Medical): No    Lack of Transportation (Non-Medical): No  Physical Activity: Not on file  Stress: Not on file  Social Connections: Not on file  Intimate Partner Violence: Not At Risk (08/27/2022)   Humiliation, Afraid, Rape, and Kick questionnaire    Fear of Current or  Ex-Partner: No    Emotionally Abused: No    Physically Abused: No    Sexually Abused: No   No current facility-administered medications on file prior to encounter.   Current Outpatient Medications on File Prior to Encounter  Medication Sig Dispense Refill   acetaminophen  (TYLENOL ) 325 MG tablet Take 2 tablets (650 mg total) by mouth every 4 (four) hours as needed (for pain scale < 4). 100 tablet 0   aspirin 81 MG chewable tablet Chew 81 mg by mouth daily.     Prenatal Vit-Fe Fumarate-FA (PRENATAL MULTIVITAMIN) TABS tablet Take 1 tablet by mouth daily at 12 noon.     No Known Allergies  ROS:  Review of Systems  Constitutional:  Negative for chills, fatigue and fever.  Eyes:  Negative for visual disturbance.  Respiratory:  Negative for shortness of breath.   Cardiovascular:  Negative for chest pain.  Gastrointestinal:  Positive for abdominal pain, nausea and vomiting.  Genitourinary:  Negative for difficulty urinating, dysuria, flank pain, pelvic pain, vaginal bleeding, vaginal discharge and vaginal pain.  Neurological:  Positive for dizziness, weakness and headaches.  Psychiatric/Behavioral: Negative.       I have reviewed patient's Past Medical Hx, Surgical Hx, Family Hx, Social Hx, medications and allergies.   Physical Exam  Patient Vitals for the past 24 hrs:  BP Temp Pulse Resp  09/05/23 1742 127/75 98.7  F (37.1 C) 70 18   Constitutional: Well-developed, well-nourished female in no acute distress.  Cardiovascular: normal rate Respiratory: normal effort GI: Abd soft, non-tender. Pos BS x 4 MS: Extremities nontender, no edema, normal ROM Neurologic: Alert and oriented x 4.  GU: Neg CVAT.  PELVIC EXAM:   Dilation: Closed Effacement (%): Thick Cervical Position: Posterior Exam by:: Olam Boards, CNM   FHT 166 by doppler  LAB RESULTS Results for orders placed or performed during the hospital encounter of 09/05/23 (from the past 24 hours)  Urinalysis,  Routine w reflex microscopic -Urine, Clean Catch     Status: Abnormal   Collection Time: 09/05/23  5:58 PM  Result Value Ref Range   Color, Urine STRAW (A) YELLOW   APPearance CLEAR CLEAR   Specific Gravity, Urine 1.011 1.005 - 1.030   pH 8.0 5.0 - 8.0   Glucose, UA NEGATIVE NEGATIVE mg/dL   Hgb urine dipstick NEGATIVE NEGATIVE   Bilirubin Urine NEGATIVE NEGATIVE   Ketones, ur NEGATIVE NEGATIVE mg/dL   Protein, ur NEGATIVE NEGATIVE mg/dL   Nitrite NEGATIVE NEGATIVE   Leukocytes,Ua NEGATIVE NEGATIVE   RBC / HPF 0-5 0 - 5 RBC/hpf   WBC, UA 0-5 0 - 5 WBC/hpf   Bacteria, UA RARE (A) NONE SEEN   Squamous Epithelial / HPF 6-10 0 - 5 /HPF  Wet prep, genital     Status: None   Collection Time: 09/05/23  6:47 PM   Specimen: Vaginal  Result Value Ref Range   Yeast Wet Prep HPF POC NONE SEEN NONE SEEN   Trich, Wet Prep NONE SEEN NONE SEEN   Clue Cells Wet Prep HPF POC NONE SEEN NONE SEEN   WBC, Wet Prep HPF POC <10 <10   Sperm NONE SEEN        IMAGING No results found.  MAU Management/MDM: Orders Placed This Encounter  Procedures   Wet prep, genital   Urinalysis, Routine w reflex microscopic -Urine, Clean Catch   CBC   Comprehensive metabolic panel    Meds ordered this encounter  Medications   acetaminophen  (TYLENOL ) tablet 1,000 mg    UA without evidence of dehydration.  Pt decline medicine for nausea but Tylenol  1000 mg given for h/a, and CBC, CMP pending.  Pt given PO fluids and saltine crackers.  Report to Earnie Pouch, CNM with labs pending.     Olam Boards Certified Nurse-Midwife 09/05/2023  7:51 PM

## 2023-09-05 NOTE — MAU Note (Signed)
 Carolyn Carroll is a 28 y.o. at [redacted]w[redacted]d here in MAU reporting: having headache and stomach/back pain cramping for 2 days. Stated her father passed away this week and the funeral was yesterday. Stated she also feels weak and dizzy. Stated has not eaten much and feels nausea and and vomits it back up.  Has taken tylenol  for headache but has not helped.   LMP:  Onset of complaint: 2-3 days Pain score: 5 Vitals:   09/05/23 1742  BP: 127/75  Pulse: 70  Resp: 18  Temp: 98.7 F (37.1 C)     FHT: 166  Lab orders placed from triage: u/a

## 2023-09-05 NOTE — MAU Provider Note (Incomplete Revision)
 Chief Complaint: Abdominal Pain and Headache   Event Date/Time   First Provider Initiated Contact with Patient 09/05/23 1833      SUBJECTIVE HPI: Carolyn Carroll is a 28 y.o. [id number] at [redacted]w[redacted]d who presents to maternity admissions reporting abdominal pain, headache, weakness, dizziness and nausea/vomiting.  Her father passed away and the funeral was yesterday. She has not eaten or drank much in 2-3 days.  She reports feeling fetal movement.   She denies vaginal bleeding, vaginal itching/burning, urinary symptoms.  HPI  Past Medical History:  Diagnosis Date   Medical history non-contributory    Past Surgical History:  Procedure Laterality Date   NO PAST SURGERIES     Social History   Socioeconomic History   Marital status: Single    Spouse name: Not on file   Number of children: Not on file   Years of education: Not on file   Highest education level: Not on file  Occupational History   Not on file  Tobacco Use   Smoking status: Never   Smokeless tobacco: Never  Vaping Use   Vaping status: Never Used  Substance and Sexual Activity   Alcohol use: Not Currently   Drug use: Never   Sexual activity: Not Currently    Birth control/protection: None  Other Topics Concern   Not on file  Social History Narrative   Not on file   Social Drivers of Health   Financial Resource Strain: Not on file  Food Insecurity: No Food Insecurity (08/27/2022)   Hunger Vital Sign    Worried About Running Out of Food in the Last Year: Never true    Ran Out of Food in the Last Year: Never true  Transportation Needs: No Transportation Needs (08/27/2022)   PRAPARE - Administrator, Civil Service (Medical): No    Lack of Transportation (Non-Medical): No  Physical Activity: Not on file  Stress: Not on file  Social Connections: Not on file  Intimate Partner Violence: Not At Risk (08/27/2022)   Humiliation, Afraid, Rape, and Kick questionnaire    Fear of Current or  Ex-Partner: No    Emotionally Abused: No    Physically Abused: No    Sexually Abused: No   No current facility-administered medications on file prior to encounter.   Current Outpatient Medications on File Prior to Encounter  Medication Sig Dispense Refill   acetaminophen  (TYLENOL ) 325 MG tablet Take 2 tablets (650 mg total) by mouth every 4 (four) hours as needed (for pain scale < 4). 100 tablet 0   aspirin 81 MG chewable tablet Chew 81 mg by mouth daily.     Prenatal Vit-Fe Fumarate-FA (PRENATAL MULTIVITAMIN) TABS tablet Take 1 tablet by mouth daily at 12 noon.     No Known Allergies  ROS:  Review of Systems  Constitutional:  Negative for chills, fatigue and fever.  Eyes:  Negative for visual disturbance.  Respiratory:  Negative for shortness of breath.   Cardiovascular:  Negative for chest pain.  Gastrointestinal:  Positive for abdominal pain, nausea and vomiting.  Genitourinary:  Negative for difficulty urinating, dysuria, flank pain, pelvic pain, vaginal bleeding, vaginal discharge and vaginal pain.  Neurological:  Positive for dizziness, weakness and headaches.  Psychiatric/Behavioral: Negative.       I have reviewed patient's Past Medical Hx, Surgical Hx, Family Hx, Social Hx, medications and allergies.   Physical Exam  Patient Vitals for the past 24 hrs:  BP Temp Pulse Resp  09/05/23 1742 127/75 98.7  F (37.1 C) 70 18   Constitutional: Well-developed, well-nourished female in no acute distress.  Cardiovascular: normal rate Respiratory: normal effort GI: Abd soft, non-tender. Pos BS x 4 MS: Extremities nontender, no edema, normal ROM Neurologic: Alert and oriented x 4.  GU: Neg CVAT.  PELVIC EXAM:   Dilation: Closed Effacement (%): Thick Cervical Position: Posterior Exam by:: Olam Boards, CNM   FHT 166 by doppler  LAB RESULTS Results for orders placed or performed during the hospital encounter of 09/05/23 (from the past 24 hours)  Urinalysis,  Routine w reflex microscopic -Urine, Clean Catch     Status: Abnormal   Collection Time: 09/05/23  5:58 PM  Result Value Ref Range   Color, Urine STRAW (A) YELLOW   APPearance CLEAR CLEAR   Specific Gravity, Urine 1.011 1.005 - 1.030   pH 8.0 5.0 - 8.0   Glucose, UA NEGATIVE NEGATIVE mg/dL   Hgb urine dipstick NEGATIVE NEGATIVE   Bilirubin Urine NEGATIVE NEGATIVE   Ketones, ur NEGATIVE NEGATIVE mg/dL   Protein, ur NEGATIVE NEGATIVE mg/dL   Nitrite NEGATIVE NEGATIVE   Leukocytes,Ua NEGATIVE NEGATIVE   RBC / HPF 0-5 0 - 5 RBC/hpf   WBC, UA 0-5 0 - 5 WBC/hpf   Bacteria, UA RARE (A) NONE SEEN   Squamous Epithelial / HPF 6-10 0 - 5 /HPF  Wet prep, genital     Status: None   Collection Time: 09/05/23  6:47 PM   Specimen: Vaginal  Result Value Ref Range   Yeast Wet Prep HPF POC NONE SEEN NONE SEEN   Trich, Wet Prep NONE SEEN NONE SEEN   Clue Cells Wet Prep HPF POC NONE SEEN NONE SEEN   WBC, Wet Prep HPF POC <10 <10   Sperm NONE SEEN        IMAGING No results found.  MAU Management/MDM: Orders Placed This Encounter  Procedures   Wet prep, genital   Urinalysis, Routine w reflex microscopic -Urine, Clean Catch   CBC   Comprehensive metabolic panel    Meds ordered this encounter  Medications   acetaminophen  (TYLENOL ) tablet 1,000 mg    UA without evidence of dehydration.  Pt decline medicine for nausea but Tylenol  1000 mg given for h/a, and CBC, CMP pending.  Pt given PO fluids and saltine crackers.  Report to Earnie Pouch, CNM with labs pending.     Olam Boards Certified Nurse-Midwife 09/05/2023  7:51 PM  In Person interpretor used. Assumed care.  States she is a little nauseated but can't throw up.  States has no abdominal pain.  Has low back pain now. ALso has a headache which did not improve after Tylenol .   Will give her some Zofran  and Flexeril .

## 2023-09-06 LAB — GC/CHLAMYDIA PROBE AMP (~~LOC~~) NOT AT ARMC
Chlamydia: NEGATIVE
Comment: NEGATIVE
Comment: NORMAL
Neisseria Gonorrhea: NEGATIVE

## 2023-09-14 ENCOUNTER — Other Ambulatory Visit: Payer: Self-pay | Admitting: *Deleted

## 2023-09-14 ENCOUNTER — Ambulatory Visit (HOSPITAL_BASED_OUTPATIENT_CLINIC_OR_DEPARTMENT_OTHER): Payer: Self-pay

## 2023-09-14 ENCOUNTER — Ambulatory Visit: Payer: Self-pay | Attending: Obstetrics and Gynecology | Admitting: Obstetrics and Gynecology

## 2023-09-14 VITALS — BP 116/55 | HR 72

## 2023-09-14 DIAGNOSIS — O09892 Supervision of other high risk pregnancies, second trimester: Secondary | ICD-10-CM

## 2023-09-14 DIAGNOSIS — O358XX0 Maternal care for other (suspected) fetal abnormality and damage, not applicable or unspecified: Secondary | ICD-10-CM

## 2023-09-14 DIAGNOSIS — Z36 Encounter for antenatal screening for chromosomal anomalies: Secondary | ICD-10-CM

## 2023-09-14 DIAGNOSIS — O09292 Supervision of pregnancy with other poor reproductive or obstetric history, second trimester: Secondary | ICD-10-CM

## 2023-09-14 DIAGNOSIS — Z3A22 22 weeks gestation of pregnancy: Secondary | ICD-10-CM | POA: Insufficient documentation

## 2023-09-14 DIAGNOSIS — Z8751 Personal history of pre-term labor: Secondary | ICD-10-CM

## 2023-09-14 DIAGNOSIS — O09212 Supervision of pregnancy with history of pre-term labor, second trimester: Secondary | ICD-10-CM | POA: Insufficient documentation

## 2023-09-14 DIAGNOSIS — Z363 Encounter for antenatal screening for malformations: Secondary | ICD-10-CM | POA: Insufficient documentation

## 2023-09-14 DIAGNOSIS — Z8759 Personal history of other complications of pregnancy, childbirth and the puerperium: Secondary | ICD-10-CM

## 2023-09-14 NOTE — Progress Notes (Signed)
 Maternal-Fetal Medicine Consultation Name: Genita Nilsson MRN: [id number]  G5 P3104 at 22w 2d gestation.  Patient is here for fetal anatomy scan. On cell-free fetal Linea screening, the risks of fetal aneuploidy is not increased.  MSAFP screening showed low risk for open neural tube defects.  Obstetric history is significant for 4 vaginal deliveries including 1 preterm delivery.  Her most recent delivery was in June 2024.  Ultrasound We performed fetal anatomy scan. No makers of aneuploidies or fetal structural defects are seen. Fetal biometry is ahead of gestational age by 12 days amniotic fluid is normal and good fetal activity is seen.  On transabdominal scan, the cervix looks long and closed.  Patient understands the limitations of ultrasound in detecting fetal anomalies.   I discussed the importance of correct pregnancy dating and explained the discrepancy between our measurements and her gestational age calculated from her LMP.  Patient insists that she always had regular periods and that her LMP was correct (04/11/2023). We have not amended her EDD.  Short interval between pregnancies is associated with an increased risk of fetal growth restriction and preterm labor.  We recommend fetal growth assessment in the third trimester.  Recommendations - An appointment was made for her to return in 6 weeks to assess interval fetal growth and discuss pregnancy dating. -Fetal growth assessment at 32 weeks.  Consultation including face-to-face (more than 50%) counseling 15 minutes.

## 2023-09-27 ENCOUNTER — Other Ambulatory Visit: Payer: Self-pay

## 2023-09-27 ENCOUNTER — Ambulatory Visit (INDEPENDENT_AMBULATORY_CARE_PROVIDER_SITE_OTHER): Payer: Self-pay | Admitting: Obstetrics and Gynecology

## 2023-09-27 VITALS — BP 97/61 | HR 76 | Wt 130.9 lb

## 2023-09-27 DIAGNOSIS — Z3480 Encounter for supervision of other normal pregnancy, unspecified trimester: Secondary | ICD-10-CM | POA: Insufficient documentation

## 2023-09-27 DIAGNOSIS — Z8759 Personal history of other complications of pregnancy, childbirth and the puerperium: Secondary | ICD-10-CM

## 2023-09-27 DIAGNOSIS — Z758 Other problems related to medical facilities and other health care: Secondary | ICD-10-CM

## 2023-09-27 DIAGNOSIS — Z603 Acculturation difficulty: Secondary | ICD-10-CM

## 2023-09-27 DIAGNOSIS — Z3A24 24 weeks gestation of pregnancy: Secondary | ICD-10-CM

## 2023-09-27 DIAGNOSIS — O3662X0 Maternal care for excessive fetal growth, second trimester, not applicable or unspecified: Secondary | ICD-10-CM

## 2023-09-27 DIAGNOSIS — Z8751 Personal history of pre-term labor: Secondary | ICD-10-CM

## 2023-09-27 NOTE — Progress Notes (Signed)
   PRENATAL VISIT NOTE  Transfer of care from [facility] for h/o FGR and h/o 2018 PTB  Subjective:  Carolyn Carroll is a 28 y.o. [phone] at [redacted]w[redacted]d being seen today for ongoing prenatal care.  She is currently monitored for the following issues for this high-risk pregnancy and has Language barrier; History of preterm delivery; History of prior pregnancy with IUGR newborn; and Supervision of other normal pregnancy, antepartum on their problem list.  Patient reports occasional low back discomfort.  Contractions: Not present. Vag. Bleeding: None.  Movement: Present. Denies leaking of fluid.   The following portions of the patient's history were reviewed and updated as appropriate: allergies, current medications, past family history, past medical history, past social history, past surgical history and problem list.   Objective:    Vitals:   09/27/23 1419  BP: 97/61  Pulse: 76  Weight: 130 lb 14.4 oz (59.4 kg)    Fetal Status:  Fetal Heart Rate (bpm): 170   Movement: Present    General: Alert, oriented and cooperative. Patient is in no acute distress.  Skin: Skin is warm and dry. No rash noted.   Cardiovascular: Normal heart rate noted  Respiratory: Normal respiratory effort, no problems with respiration noted  Abdomen: Soft, gravid, appropriate for gestational age.  Pain/Pressure: Absent     Pelvic: Cervical exam deferred        Extremities: Normal range of motion.  Edema: None  Mental Status: Normal mood and affect. Normal behavior. Normal judgment and thought content.   Assessment and Plan:  Pregnancy: G5P3104 at 100w1d 1. [redacted] weeks gestation of pregnancy (Primary) GTT next visit. New OB labs neg - Glucose Tolerance, 2 Hours w/1 Hour; Future - CBC; Future - RPR; Future - HIV antibody (with reflex); Future  2. Supervision of other normal pregnancy, antepartum - Glucose Tolerance, 2 Hours w/1 Hour; Future - CBC; Future - RPR; Future - HIV antibody (with reflex);  Future  3. History of prior pregnancy with IUGR newborn Serial growth u/s 7/11 anatomy u/s with MFM: 656g, >99%, ac 95%, afi wnl, anatomy wnl   4. History of preterm delivery 2019. From fall, per patient. S/p 2023 and 2024 TSVD w/o any interventions  5. Language barrier In person interpreter used  6. LGA Early GTT neg  Preterm labor symptoms and general obstetric precautions including but not limited to vaginal bleeding, contractions, leaking of fluid and fetal movement were reviewed in detail with the patient. Please refer to After Visit Summary for other counseling recommendations.   Return in about 3 weeks (around 10/18/2023) for in person, low risk ob, fasting 2hr GTT, md or app.  Future Appointments  Date Time Provider Department Center  10/26/2023  9:15 AM [facility] PROVIDER 1 [facility]  10/26/2023  9:30 AM [facility] US1 [facility]    Bebe Furry, MD

## 2023-10-02 DIAGNOSIS — O3660X0 Maternal care for excessive fetal growth, unspecified trimester, not applicable or unspecified: Secondary | ICD-10-CM | POA: Insufficient documentation

## 2023-10-18 ENCOUNTER — Encounter: Payer: Self-pay | Admitting: Obstetrics and Gynecology

## 2023-10-26 ENCOUNTER — Ambulatory Visit: Payer: Self-pay | Attending: Obstetrics and Gynecology | Admitting: Maternal & Fetal Medicine

## 2023-10-26 ENCOUNTER — Other Ambulatory Visit: Payer: Self-pay | Admitting: *Deleted

## 2023-10-26 ENCOUNTER — Other Ambulatory Visit: Payer: Self-pay

## 2023-10-26 ENCOUNTER — Ambulatory Visit (INDEPENDENT_AMBULATORY_CARE_PROVIDER_SITE_OTHER): Payer: Self-pay | Admitting: Obstetrics and Gynecology

## 2023-10-26 ENCOUNTER — Ambulatory Visit: Payer: Self-pay

## 2023-10-26 VITALS — BP 106/63 | HR 72 | Wt 129.7 lb

## 2023-10-26 DIAGNOSIS — Z758 Other problems related to medical facilities and other health care: Secondary | ICD-10-CM

## 2023-10-26 DIAGNOSIS — O09293 Supervision of pregnancy with other poor reproductive or obstetric history, third trimester: Secondary | ICD-10-CM

## 2023-10-26 DIAGNOSIS — O358XX0 Maternal care for other (suspected) fetal abnormality and damage, not applicable or unspecified: Secondary | ICD-10-CM

## 2023-10-26 DIAGNOSIS — Z8759 Personal history of other complications of pregnancy, childbirth and the puerperium: Secondary | ICD-10-CM

## 2023-10-26 DIAGNOSIS — Z8751 Personal history of pre-term labor: Secondary | ICD-10-CM

## 2023-10-26 DIAGNOSIS — Z603 Acculturation difficulty: Secondary | ICD-10-CM

## 2023-10-26 DIAGNOSIS — Z3A28 28 weeks gestation of pregnancy: Secondary | ICD-10-CM

## 2023-10-26 DIAGNOSIS — O24419 Gestational diabetes mellitus in pregnancy, unspecified control: Secondary | ICD-10-CM

## 2023-10-26 DIAGNOSIS — O3663X0 Maternal care for excessive fetal growth, third trimester, not applicable or unspecified: Secondary | ICD-10-CM | POA: Insufficient documentation

## 2023-10-26 DIAGNOSIS — O09213 Supervision of pregnancy with history of pre-term labor, third trimester: Secondary | ICD-10-CM

## 2023-10-26 DIAGNOSIS — Z3480 Encounter for supervision of other normal pregnancy, unspecified trimester: Secondary | ICD-10-CM

## 2023-10-26 NOTE — Progress Notes (Signed)
 Patient information  Patient Name: Carolyn Carroll  Patient MRN:   [id number]  Referring practice: MFM Referring Provider: [facility] Department [facility])  Problem List   Patient Active Problem List   Diagnosis Date Noted   LGA (large for gestational age) fetus affecting management of mother 10/02/2023   Supervision of other normal pregnancy, antepartum 09/27/2023   History of prior pregnancy with IUGR newborn 09/03/2023   History of preterm delivery 06/15/2022   Language barrier 07/03/2021   Maternal Fetal medicine Consult  Carolyn Carroll is a 28 y.o. [id number] at [redacted]w[redacted]d here for ultrasound and consultation. Carolyn Carroll is doing well today with no acute concerns. Today we focused on the following:   Large for gestational age The estimated fetal weight on today's ultrasound is consistent with large for gestational age (LGA). I discussed the diagnosis, management, and clinical significance of an LGA fetus. Risk factors for LGA include, but are not limited to, constitutional factors, pre-existing or gestational diabetes, maternal pre-pregnancy obesity, excessive gestational weight gain, abnormal fasting or postprandial blood glucose levels, dyslipidemia, a history of prior macrosomic newborns (defined as birth weight over 4000 g), and post-term pregnancy. Racial and ethnic factors may also contribute. Diagnosis is made via prenatal ultrasound; however, the prediction of neonatal weight remains imprecise, with an estimated variance of up to 20% between the expected and actual birth weight. Notably, ultrasound accuracy declines as fetal weight exceeds 4000 g, making precise estimation increasingly difficult. LGA serves as a surrogate marker for fetal macrosomia, which is defined by an absolute birth weight of over 4000 g, regardless of gestational age. The estimated fetal weight at the time of delivery may influence the mode of delivery. In the  absence of gestational diabetes, cesarean delivery should be considered if the estimated fetal weight exceeds 5000 g to reduce the risk of higher-order perineal lacerations, which may lead to urinary or fecal incontinence, pelvic organ prolapse, and shoulder dystocia--with or without permanent fetal neurologic injury or, in rare cases, fetal death.   Unfortunately the patient missed her glucose challenge test today.  It is rescheduled for 26 August.  Sonographic findings Single intrauterine pregnancy at 28w 2d.  Fetal cardiac activity:  Observed and appears normal. Presentation: Cephalic. Interval fetal anatomy appears normal. Fetal biometry shows the estimated fetal weight at the 98 percentile. Amniotic fluid volume: Within normal limits. MVP: 4.79 cm. Placenta: Posterior.  There are limitations of prenatal ultrasound such as the inability to detect certain abnormalities due to poor visualization. Various factors such as fetal position, gestational age and maternal body habitus may increase the difficulty in visualizing the fetal anatomy.    Recommendations - Serial growth ultrasounds every 4 weeks until delivery as long as the fetus remains LGA - GTT 10/30/23  An in-person interpreter was used in the patient's preferred language for today's visit.   Review of Systems: A review of systems was performed and was negative except per HPI   Vitals and Physical Exam    10/26/2023    9:06 AM 10/26/2023    8:15 AM 09/27/2023    2:19 PM  Vitals with BMI  Weight  129 lbs 11 oz 130 lbs 14 oz  Systolic 104 106 97  Diastolic 62 63 61  Pulse 67 72 76    Sitting comfortably on the sonogram table Nonlabored breathing Normal rate and rhythm Abdomen is nontender  Past pregnancies OB History  Gravida Para Term Preterm AB Living  5 4  3 1  4   SAB IAB Ectopic Multiple Live Births     0 4    # Outcome Date GA Lbr Len/2nd Weight Sex Type Anes PTL Lv  5 Current           4 Term 08/27/22 [redacted]w[redacted]d  01:15 / 00:24 5 lb 2.2 oz (2.33 kg) M Vag-Spont None  LIV  3 Term 07/03/21 [redacted]w[redacted]d 17:20 / 00:01 5 lb 14.9 oz (2.69 kg) M Vag-Spont None  LIV  2 Preterm 04/13/17 [redacted]w[redacted]d  4 lb 8 oz (2.041 kg) M Vag-Spont   LIV  1 Term 06/30/12 [redacted]w[redacted]d  7 lb 4 oz (3.289 kg) M Vag-Spont   LIV     I spent 20 minutes reviewing the patients chart, including labs and images as well as counseling the patient about her medical conditions. Greater than 50% of the time was spent in direct face-to-face patient counseling.  Delora Smaller  MFM, [facility]   10/26/2023  9:44 AM

## 2023-10-26 NOTE — Progress Notes (Signed)
   PRENATAL VISIT NOTE  Subjective:  Carolyn Carroll is a 28 y.o. [phone] at [redacted]w[redacted]d being seen today for ongoing prenatal care.  She is currently monitored for the following issues for this low-risk pregnancy and has Language barrier; History of preterm delivery; History of prior pregnancy with IUGR newborn; Supervision of other normal pregnancy, antepartum; and LGA (large for gestational age) fetus affecting management of mother on their problem list.  Patient reports occasional low back discomfort, no lower urinary tract s/s.  Contractions: Not present. Vag. Bleeding: None.  Movement: Present. Denies leaking of fluid.   The following portions of the patient's history were reviewed and updated as appropriate: allergies, current medications, past family history, past medical history, past social history, past surgical history and problem list.   Objective:    Vitals:   10/26/23 0815  BP: 106/63  Pulse: 72  Weight: 129 lb 11.2 oz (58.8 kg)    Fetal Status:  Fetal Heart Rate (bpm): 155   Movement: Present    General: Alert, oriented and cooperative. Patient is in no acute distress.  Skin: Skin is warm and dry. No rash noted.   Cardiovascular: Normal heart rate noted  Respiratory: Normal respiratory effort, no problems with respiration noted  Abdomen: Soft, gravid, appropriate for gestational age.  Pain/Pressure: Present     Pelvic: Cervical exam deferred        Extremities: Normal range of motion.  Edema: Trace  Mental Status: Normal mood and affect. Normal behavior. Normal judgment and thought content.   Assessment and Plan:  Pregnancy: G5P3104 at [redacted]w[redacted]d 1. [redacted] weeks gestation of pregnancy (Primary) Pregnancy belt, stretches and exercises advised 28wk labs today  2. Language barrier In person interpreter used  3. History of prior pregnancy with IUGR newborn Follow up growth today  4. Excessive fetal growth affecting management of pregnancy in third trimester,  single or unspecified fetus At last visit  Preterm labor symptoms and general obstetric precautions including but not limited to vaginal bleeding, contractions, leaking of fluid and fetal movement were reviewed in detail with the patient. Please refer to After Visit Summary for other counseling recommendations.   Return in about 2 weeks (around 11/09/2023) for low risk ob, in person, md or app.  Future Appointments  Date Time Provider Department Center  10/26/2023  9:15 AM [facility] PROVIDER 1 [facility]  10/26/2023  9:30 AM [facility] US1 [facility]  10/26/2023 10:40 AM [facility] LAB [facility]    Bebe Furry, MD   Addendum: Patient scheduled for 28wk labs same time as her follow up u/s. Pt to come back for lab only visit.

## 2023-10-30 ENCOUNTER — Other Ambulatory Visit: Payer: Self-pay

## 2023-10-30 DIAGNOSIS — Z3A24 24 weeks gestation of pregnancy: Secondary | ICD-10-CM

## 2023-10-30 DIAGNOSIS — Z3480 Encounter for supervision of other normal pregnancy, unspecified trimester: Secondary | ICD-10-CM

## 2023-10-31 LAB — CBC
Hematocrit: 36.1 % (ref 34.0–46.6)
Hemoglobin: 11.4 g/dL (ref 11.1–15.9)
MCH: 25.4 pg — ABNORMAL LOW (ref 26.6–33.0)
MCHC: 31.6 g/dL (ref 31.5–35.7)
MCV: 81 fL (ref 79–97)
Platelets: 271 x10E3/uL (ref 150–450)
RBC: 4.48 x10E6/uL (ref 3.77–5.28)
RDW: 13.5 % (ref 11.7–15.4)
WBC: 7 x10E3/uL (ref 3.4–10.8)

## 2023-10-31 LAB — GLUCOSE TOLERANCE, 2 HOURS W/ 1HR
Glucose, 1 hour: 159 mg/dL (ref 70–179)
Glucose, 2 hour: 107 mg/dL (ref 70–152)
Glucose, Fasting: 83 mg/dL (ref 70–91)

## 2023-10-31 LAB — HIV ANTIBODY (ROUTINE TESTING W REFLEX): HIV Screen 4th Generation wRfx: NONREACTIVE

## 2023-10-31 LAB — RPR: RPR Ser Ql: NONREACTIVE

## 2023-11-12 ENCOUNTER — Other Ambulatory Visit: Payer: Self-pay

## 2023-11-12 ENCOUNTER — Ambulatory Visit (INDEPENDENT_AMBULATORY_CARE_PROVIDER_SITE_OTHER): Payer: Self-pay | Admitting: Family Medicine

## 2023-11-12 VITALS — BP 114/73 | HR 86 | Wt 128.0 lb

## 2023-11-12 DIAGNOSIS — O3663X0 Maternal care for excessive fetal growth, third trimester, not applicable or unspecified: Secondary | ICD-10-CM

## 2023-11-12 DIAGNOSIS — Z758 Other problems related to medical facilities and other health care: Secondary | ICD-10-CM

## 2023-11-12 DIAGNOSIS — Z3A30 30 weeks gestation of pregnancy: Secondary | ICD-10-CM

## 2023-11-12 DIAGNOSIS — Z8751 Personal history of pre-term labor: Secondary | ICD-10-CM

## 2023-11-12 DIAGNOSIS — Z3480 Encounter for supervision of other normal pregnancy, unspecified trimester: Secondary | ICD-10-CM

## 2023-11-12 DIAGNOSIS — Z8759 Personal history of other complications of pregnancy, childbirth and the puerperium: Secondary | ICD-10-CM

## 2023-11-12 DIAGNOSIS — M549 Dorsalgia, unspecified: Secondary | ICD-10-CM

## 2023-11-12 DIAGNOSIS — Z603 Acculturation difficulty: Secondary | ICD-10-CM

## 2023-11-12 DIAGNOSIS — O99891 Other specified diseases and conditions complicating pregnancy: Secondary | ICD-10-CM

## 2023-11-12 NOTE — Progress Notes (Signed)
   PRENATAL VISIT NOTE  Subjective:  Carolyn Carroll is a 28 y.o. [phone] at [redacted]w[redacted]d being seen today for ongoing prenatal care.  She is currently monitored for the following issues for this high-risk pregnancy and has Language barrier; History of preterm delivery; History of prior pregnancy with IUGR newborn; Supervision of other normal pregnancy, antepartum; and LGA (large for gestational age) fetus affecting management of mother on their problem list.  Patient reports backache, no bleeding, no contractions, no cramping, and no leaking.  Contractions: Not present. Vag. Bleeding: None.  Movement: Present. Denies leaking of fluid.   The following portions of the patient's history were reviewed and updated as appropriate: allergies, current medications, past family history, past medical history, past social history, past surgical history and problem list.   Objective:    Vitals:   11/12/23 1406  BP: 114/73  Pulse: 86  Weight: 128 lb (58.1 kg)    Fetal Status:  Fetal Heart Rate (bpm): 154   Movement: Present    General: Alert, oriented and cooperative. Patient is in no acute distress.  Skin: Skin is warm and dry. No rash noted.   Cardiovascular: Normal heart rate noted  Respiratory: Normal respiratory effort, no problems with respiration noted  Abdomen: Soft, gravid, appropriate for gestational age.  Pain/Pressure: Absent     Pelvic: Cervical exam deferred        Extremities: Normal range of motion.  Edema: None  Mental Status: Normal mood and affect. Normal behavior. Normal judgment and thought content.   Assessment and Plan:  Pregnancy: [id number] at [redacted]w[redacted]d 1. Supervision of other normal pregnancy, antepartum FHR BP appropriate today  2. Excessive fetal growth affecting management of pregnancy in third trimester, single or unspecified fetus 98th percentile at most recent ultrasound.  Scheduled for follow-up ultrasound on 9/18. Continue serial growth ultrasounds every 4  weeks until delivery as long as the fetus remains LGA GTT was within normal limits  3. History of prior pregnancy with IUGR newborn No signs of labor  4. Language barrier Spanish interpreter used  5. History of preterm delivery  6. Back pain affecting pregnancy Discussed use of Tylenol , Flexeril , pregnancy support band.  Also discussed ice and heat to the low back.  7. [redacted] weeks gestation of pregnancy (Primary)   Preterm labor symptoms and general obstetric precautions including but not limited to vaginal bleeding, contractions, leaking of fluid and fetal movement were reviewed in detail with the patient. Please refer to After Visit Summary for other counseling recommendations.   No follow-ups on file.  Future Appointments  Date Time Provider Department Center  11/22/2023  2:45 PM [facility] PROVIDER 1 [facility]  11/22/2023  3:00 PM [facility] US2 [facility]    Norleen LULLA Rover, MD

## 2023-11-22 ENCOUNTER — Other Ambulatory Visit: Payer: Self-pay | Admitting: *Deleted

## 2023-11-22 ENCOUNTER — Ambulatory Visit (HOSPITAL_BASED_OUTPATIENT_CLINIC_OR_DEPARTMENT_OTHER): Payer: Self-pay

## 2023-11-22 ENCOUNTER — Ambulatory Visit: Payer: Self-pay | Attending: Maternal & Fetal Medicine | Admitting: Obstetrics

## 2023-11-22 VITALS — BP 122/62 | HR 77

## 2023-11-22 DIAGNOSIS — O09293 Supervision of pregnancy with other poor reproductive or obstetric history, third trimester: Secondary | ICD-10-CM

## 2023-11-22 DIAGNOSIS — O3663X0 Maternal care for excessive fetal growth, third trimester, not applicable or unspecified: Secondary | ICD-10-CM

## 2023-11-22 DIAGNOSIS — O09893 Supervision of other high risk pregnancies, third trimester: Secondary | ICD-10-CM | POA: Insufficient documentation

## 2023-11-22 DIAGNOSIS — O09213 Supervision of pregnancy with history of pre-term labor, third trimester: Secondary | ICD-10-CM

## 2023-11-22 DIAGNOSIS — Z3A32 32 weeks gestation of pregnancy: Secondary | ICD-10-CM

## 2023-11-22 DIAGNOSIS — O24419 Gestational diabetes mellitus in pregnancy, unspecified control: Secondary | ICD-10-CM

## 2023-11-22 DIAGNOSIS — Z8751 Personal history of pre-term labor: Secondary | ICD-10-CM

## 2023-11-22 DIAGNOSIS — Z8759 Personal history of other complications of pregnancy, childbirth and the puerperium: Secondary | ICD-10-CM

## 2023-11-22 DIAGNOSIS — Z362 Encounter for other antenatal screening follow-up: Secondary | ICD-10-CM | POA: Insufficient documentation

## 2023-11-22 NOTE — Progress Notes (Signed)
 MFM Consult Note  Carolyn Carroll is currently at 32 weeks and 1 day.  She has been followed as a large for gestational age fetus was noted on her prior ultrasound exams.    She denies any problems since her last exam and has screened negative for gestational diabetes  Sonographic findings Single intrauterine pregnancy at 32w 1d.  Fetal cardiac activity:  Observed and appears normal. Presentation: Cephalic. Interval fetal anatomy appears normal. Fetal biometry shows the estimated fetal weight of 5 pounds 2 ounces which measures at the 91st percentile. Amniotic fluid volume: Within normal limits. MVP: 6.45 cm. Placenta: Posterior.  There are limitations of prenatal ultrasound such as the inability to detect certain abnormalities due to poor visualization. Various factors such as fetal position, gestational age and maternal body habitus may increase the difficulty in visualizing the fetal anatomy.    Due to the large for gestational age fetus, delivery may be considered at around 39 weeks.  A follow-up growth scan was scheduled in 5 weeks to estimate the fetal weight closer to delivery.    The patient stated that all of her questions were answered today.    All conversations were held with the patient today with the help of a Spanish interpreter.  A total of 20 minutes was spent counseling and coordinating the care for this patient.  Greater than 50% of the time was spent in direct face-to-face contact.

## 2023-12-03 ENCOUNTER — Encounter: Payer: Self-pay | Admitting: Obstetrics and Gynecology

## 2023-12-27 ENCOUNTER — Other Ambulatory Visit: Payer: Self-pay | Admitting: Obstetrics

## 2023-12-27 ENCOUNTER — Ambulatory Visit: Payer: Self-pay | Attending: Obstetrics | Admitting: Maternal & Fetal Medicine

## 2023-12-27 ENCOUNTER — Inpatient Hospital Stay (HOSPITAL_COMMUNITY)
Admission: AD | Admit: 2023-12-27 | Discharge: 2023-12-29 | DRG: 807 | Disposition: A | Discharge status: Home / Self Care | Payer: MEDICAID | Attending: Obstetrics and Gynecology | Admitting: Obstetrics and Gynecology

## 2023-12-27 ENCOUNTER — Encounter (HOSPITAL_COMMUNITY): Payer: Self-pay | Admitting: Obstetrics and Gynecology

## 2023-12-27 ENCOUNTER — Inpatient Hospital Stay (HOSPITAL_COMMUNITY)
Admission: AD | Admit: 2023-12-27 | Discharge: 2023-12-27 | Disposition: A | Discharge status: Home / Self Care | Payer: Self-pay | Attending: Obstetrics and Gynecology | Admitting: Obstetrics and Gynecology

## 2023-12-27 ENCOUNTER — Ambulatory Visit (HOSPITAL_BASED_OUTPATIENT_CLINIC_OR_DEPARTMENT_OTHER): Payer: Self-pay

## 2023-12-27 DIAGNOSIS — Z23 Encounter for immunization: Secondary | ICD-10-CM

## 2023-12-27 DIAGNOSIS — Z603 Acculturation difficulty: Secondary | ICD-10-CM

## 2023-12-27 DIAGNOSIS — O3663X0 Maternal care for excessive fetal growth, third trimester, not applicable or unspecified: Principal | ICD-10-CM | POA: Diagnosis present

## 2023-12-27 DIAGNOSIS — O288 Other abnormal findings on antenatal screening of mother: Secondary | ICD-10-CM

## 2023-12-27 DIAGNOSIS — Z362 Encounter for other antenatal screening follow-up: Secondary | ICD-10-CM | POA: Insufficient documentation

## 2023-12-27 DIAGNOSIS — O471 False labor at or after 37 completed weeks of gestation: Secondary | ICD-10-CM | POA: Insufficient documentation

## 2023-12-27 DIAGNOSIS — Z3689 Encounter for other specified antenatal screening: Secondary | ICD-10-CM

## 2023-12-27 DIAGNOSIS — Z3493 Encounter for supervision of normal pregnancy, unspecified, third trimester: Principal | ICD-10-CM

## 2023-12-27 DIAGNOSIS — Z3A37 37 weeks gestation of pregnancy: Secondary | ICD-10-CM | POA: Diagnosis not present

## 2023-12-27 DIAGNOSIS — O09293 Supervision of pregnancy with other poor reproductive or obstetric history, third trimester: Secondary | ICD-10-CM

## 2023-12-27 DIAGNOSIS — O479 False labor, unspecified: Secondary | ICD-10-CM

## 2023-12-27 DIAGNOSIS — Z3480 Encounter for supervision of other normal pregnancy, unspecified trimester: Secondary | ICD-10-CM

## 2023-12-27 DIAGNOSIS — Z0371 Encounter for suspected problem with amniotic cavity and membrane ruled out: Secondary | ICD-10-CM | POA: Insufficient documentation

## 2023-12-27 DIAGNOSIS — O3660X0 Maternal care for excessive fetal growth, unspecified trimester, not applicable or unspecified: Secondary | ICD-10-CM

## 2023-12-27 DIAGNOSIS — O09213 Supervision of pregnancy with history of pre-term labor, third trimester: Secondary | ICD-10-CM

## 2023-12-27 DIAGNOSIS — Z7982 Long term (current) use of aspirin: Secondary | ICD-10-CM

## 2023-12-27 DIAGNOSIS — Z758 Other problems related to medical facilities and other health care: Secondary | ICD-10-CM

## 2023-12-27 DIAGNOSIS — O09893 Supervision of other high risk pregnancies, third trimester: Secondary | ICD-10-CM | POA: Insufficient documentation

## 2023-12-27 DIAGNOSIS — O26893 Other specified pregnancy related conditions, third trimester: Secondary | ICD-10-CM | POA: Diagnosis present

## 2023-12-27 LAB — GROUP B STREP BY PCR: Group B strep by PCR: NEGATIVE

## 2023-12-27 LAB — WET PREP, GENITAL
Sperm: NONE SEEN
Trich, Wet Prep: NONE SEEN
WBC, Wet Prep HPF POC: >=10 — AB (ref <10)
Yeast Wet Prep HPF POC: NONE SEEN

## 2023-12-27 LAB — POCT FERN TEST: POCT Fern Test: NEGATIVE

## 2023-12-27 MED ORDER — ACETAMINOPHEN 325 MG PO TABS: 650.0000 mg | ORAL_TABLET | ORAL | Status: DC | PRN

## 2023-12-27 MED ORDER — OXYCODONE-ACETAMINOPHEN 5-325 MG PO TABS: 2.0000 | ORAL_TABLET | ORAL | Status: DC | PRN

## 2023-12-27 MED ORDER — OXYTOCIN 10 UNIT/ML IJ SOLN
INTRAMUSCULAR | Status: AC
  Administered 2023-12-27: 10 [IU]
  Filled 2023-12-27: qty 1

## 2023-12-27 MED ORDER — LACTATED RINGERS IV SOLN: INTRAVENOUS | Status: DC

## 2023-12-27 MED ORDER — LACTATED RINGERS IV SOLN: 500.0000 mL | INTRAVENOUS | Status: DC | PRN

## 2023-12-27 MED ORDER — OXYTOCIN BOLUS FROM INFUSION: 333.0000 mL | Freq: Once | INTRAVENOUS | Status: DC

## 2023-12-27 MED ORDER — OXYTOCIN-SODIUM CHLORIDE 30-0.9 UT/500ML-% IV SOLN: 2.5000 [IU]/h | INTRAVENOUS | Status: DC

## 2023-12-27 MED ORDER — OXYCODONE-ACETAMINOPHEN 5-325 MG PO TABS
1.0000 | ORAL_TABLET | ORAL | Status: DC | PRN
  Administered 2023-12-28: 1 via ORAL
  Filled 2023-12-27: qty 1

## 2023-12-27 MED ORDER — LIDOCAINE HCL (PF) 1 % IJ SOLN: 30.0000 mL | INTRAMUSCULAR | Status: DC | PRN

## 2023-12-27 MED ORDER — METRONIDAZOLE 500 MG PO TABS: 500.0000 mg | ORAL_TABLET | Freq: Two times a day (BID) | ORAL | 0 refills | Status: AC

## 2023-12-27 MED ORDER — ONDANSETRON HCL 4 MG/2ML IJ SOLN: 4.0000 mg | Freq: Four times a day (QID) | INTRAMUSCULAR | Status: DC | PRN

## 2023-12-27 MED ORDER — SOD CITRATE-CITRIC ACID 500-334 MG/5ML PO SOLN: 30.0000 mL | ORAL | Status: DC | PRN

## 2023-12-27 NOTE — Discharge Summary (Shared)
 Postpartum Discharge Summary  Date of Service updated***     Patient Name: Carolyn Carroll DOB: Jun 29, 1995 MRN: [id number]  Date of admission: 12/27/2023 Delivery date:12/27/2023 Delivering provider: NEWTON MERING Date of discharge: 12/27/2023  Admitting diagnosis: Pregnancy at 37/1. Active labor Intrauterine pregnancy: [redacted]w[redacted]d     Secondary diagnosis:  h/o FGR in prior pregnancy.    Discharge diagnosis: Term Pregnancy Delivered                                              Post partum procedures:{Postpartum procedures:23558} Augmentation: N/A Complications: None  Hospital course: Onset of Labor With Vaginal Delivery      28 y.o. yo [id number] at [redacted]w[redacted]d was admitted in Active Labor on 12/27/2023. Labor course was complicated by precipitous delivery  Membrane Rupture Time/Date:  ,   Delivery Method:Vaginal, Spontaneous Operative Delivery:N/A Episiotomy: None Lacerations:    Patient had a postpartum course complicated by ***.  She is ambulating, tolerating a regular diet, passing flatus, and urinating well. Patient is discharged home in stable condition on 12/27/23.  Newborn Data: Birth date:12/27/2023 Birth time:11:18 PM Gender:Female Living status:  Apgars: ,  Weight:   Magnesium  Sulfate received: {Mag received:30440022} BMZ received: No Rhophylac:N/A T-DaP:{Tdap:23962} Flu: {Qol:76036} RSV Vaccine received: {RSV:31013} Transfusion:{Transfusion received:30440034} Immunizations administered: There is no immunization history for the selected administration types on file for this patient.  Physical exam  There were no vitals filed for this visit. General: {Exam; general:21111117} Lochia: {Desc; appropriate/inappropriate:30686::appropriate} Uterine Fundus: {Desc; firm/soft:30687} Incision: {Exam; incision:21111123} DVT Evaluation: {Exam; dvt:2111122} Labs: Lab Results  Component Value Date   WBC 7.0 10/30/2023   HGB 11.4 10/30/2023    HCT 36.1 10/30/2023   MCV 81 10/30/2023   PLT 271 10/30/2023      Latest Ref Rng & Units 09/05/2023    9:22 PM  CMP  Glucose 70 - 99 mg/dL 889   BUN 6 - 20 mg/dL 8   Creatinine 9.55 - 8.99 mg/dL 9.40   Sodium 864 - 854 mmol/L 134   Potassium 3.5 - 5.1 mmol/L 3.4   Chloride 98 - 111 mmol/L 105   CO2 22 - 32 mmol/L 19   Calcium 8.9 - 10.3 mg/dL 8.5   Total Protein 6.5 - 8.1 g/dL 6.1   Total Bilirubin 0.0 - 1.2 mg/dL 0.2   Alkaline Phos 38 - 126 U/L 54   AST 15 - 41 U/L 14   ALT 0 - 44 U/L 12    Edinburgh Score:    08/28/2022   12:10 PM  Edinburgh Postnatal Depression Scale Screening Tool  I have been able to laugh and see the funny side of things. 0   I have looked forward with enjoyment to things. 0   I have blamed myself unnecessarily when things went wrong. 0   I have been anxious or worried for no good reason. 0   I have felt scared or panicky for no good reason. 0   Things have been getting on top of me. 0   I have been so unhappy that I have had difficulty sleeping. 0   I have felt sad or miserable. 0   I have been so unhappy that I have been crying. 0   The thought of harming myself has occurred to me. 0   Edinburgh Postnatal Depression Scale Total 0  Data saved with a previous flowsheet row definition      After visit meds:  Allergies as of 12/27/2023   No Known Allergies   Med Rec must be completed prior to using this [facility]***        Discharge home in stable condition Infant Feeding: {Baby feeding:23562} Infant Disposition:{CHL IP OB HOME WITH FNUYZM:76418} Discharge instruction: per After Visit Summary and Postpartum booklet. Activity: Advance as tolerated. Pelvic rest for 6 weeks.  Diet: {OB diet:21111121} Anticipated Birth Control: {Birth Control:23956} Postpartum Appointment:{Outpatient follow up:23559} Additional Postpartum F/U: {PP Procedure:23957} Future Appointments:No future appointments. Follow up Visit:  [X]  26m pp visit  request sent 10/23    12/27/2023 Bebe Furry, MD

## 2023-12-27 NOTE — MAU Note (Signed)
 Pt directly to room 129 with urge to push, SVE 10/100/-1, pt denies ROM, positive bloody show. Fhr 140's. Call to [facility], CNM . Pt to l/d per stretcher with dr jomarie at bedside.

## 2023-12-27 NOTE — MAU Note (Signed)
 Carolyn Carroll [name] is a 28 y.o. at [redacted]w[redacted]d here in MAU reporting: went to appointment today and they did u/s and told her her water was broke.  Had been leaking 3 days. Reports she is hang ctx about q 30 min and good fetal movement.  OB tole her to come to MAU right away.   LMP:  Onset of complaint: 3days Pain score: 7  Vitals:   12/27/23 1734  BP: 123/80  Pulse: 72  Resp: 18  Temp: 98.6 F (37 C)     FHT: 146   Lab orders placed from triage:

## 2023-12-27 NOTE — Discharge Instructions (Signed)
 Follow-up for routine prenatal care at [facility] health department Follow-up BPP needed next week-the office will contact you with an appointment

## 2023-12-27 NOTE — Progress Notes (Signed)
 Patient information  Patient Name: Carolyn Carroll  Patient MRN:   [id number]  Referring practice: MFM Referring Provider: [facility])  Problem List   Patient Active Problem List   Diagnosis Date Noted   LGA (large for gestational age) fetus affecting management of mother 10/02/2023   Supervision of other normal pregnancy, antepartum 09/27/2023   History of prior pregnancy with IUGR newborn 09/03/2023   History of preterm delivery 06/15/2022   Language barrier 07/03/2021   Maternal Fetal medicine Consult  Carolyn Carroll is a 28 y.o. [id number] at [redacted]w[redacted]d here for ultrasound and consultation. Carolyn Carroll is doing well today. Today we focused on the following:   The patient was here for a follow-up growth ultrasound due to having a large for gestational age fetus.  Today the EFW is at the 57 percentile.  The amniotic fluid was noted to be low.  The patient reports that her water may have broke 3 days ago.  I discussed the importance of going to the hospital for evaluation.  She will go promptly to the MAU for assessment.  I have notified the on-call provider who will await her arrival.  The patient had time to ask questions that were answered to her satisfaction.  She verbalized understanding and agrees to proceed with the plan below.  Sonographic findings Single intrauterine pregnancy. Fetal cardiac activity: Observed. Presentation: Cephalic. Interval fetal anatomy appears normal. Fetal biometry shows the estimated fetal weight at the 57 percentile. Amniotic fluid: Lower end of normal.  MVP: 3.47 cm. Placenta: Posterior. BPP: 8/8.   There are limitations of prenatal ultrasound such as the inability to detect certain abnormalities due to poor visualization. Various factors such as fetal position, gestational age and maternal body habitus may increase the difficulty in visualizing the fetal anatomy.     Recommendations - Sent to MAU for labor rule out.  If the patient is not in labor then a weekly BPP should be done due to the lower end of normal fluid.  Low threshold to admit for delivery given the possibility of rupture membranes at term.  An interpreter with the [phone] was used for today's visit on the [employer] interpreter.  Review of Systems: A review of systems was performed and was negative except per HPI   Vitals and Physical Exam    12/27/2023    3:33 PM 11/22/2023    2:49 PM 11/12/2023    2:06 PM  Vitals with BMI  Weight   128 lbs  Systolic 119 122 885  Diastolic 67 62 73  Pulse 66 77 86    Sitting comfortably on the sonogram table Nonlabored breathing Normal rate and rhythm Abdomen is nontender  Past pregnancies OB History  Gravida Para Term Preterm AB Living  5 4 3 1  4   SAB IAB Ectopic Multiple Live Births     0 4    # Outcome Date GA Lbr Len/2nd Weight Sex Type Anes PTL Lv  5 Current           4 Term 08/27/22 [redacted]w[redacted]d 01:15 / 00:24 5 lb 2.2 oz (2.33 kg) M Vag-Spont None  LIV  3 Term 07/03/21 [redacted]w[redacted]d 17:20 / 00:01 5 lb 14.9 oz (2.69 kg) M Vag-Spont None  LIV  2 Preterm 04/13/17 [redacted]w[redacted]d  4 lb 8 oz (2.041 kg) M Vag-Spont   LIV  1 Term 06/30/12 [redacted]w[redacted]d  7 lb 4 oz (3.289 kg) M Vag-Spont  LIV     I spent 20 minutes reviewing the patients chart, including labs and images as well as counseling the patient about her medical conditions. Greater than 50% of the time was spent in direct face-to-face patient counseling.  Delora Smaller  MFM, [facility]   12/27/2023  4:37 PM

## 2023-12-27 NOTE — MAU Provider Note (Addendum)
 Chief Complaint:  Rupture of Membranes   HPI    Carolyn Carroll is a 28 y.o. [phone] at [redacted]w[redacted]d who presents to maternity admissions reporting she was sent over from MFM for evaluation of rupture of membranes due to a borderline low AFI and patient complaint of increased vaginal discharge versus leaking of fluid for approximately 3 days.  Patient had a BPP of 8 out of 8 with an MVP equal to 3.47 cm.  Patient was informed at the time of visit if not admitted she will need weekly biophysical profiles until delivery due to borderline normal AFI.  Pregnancy Course: [facility]  Past Medical History:  Diagnosis Date   Medical history non-contributory    OB History  Gravida Para Term Preterm AB Living  5 4 3 1  4   SAB IAB Ectopic Multiple Live Births     0 4    # Outcome Date GA Lbr Len/2nd Weight Sex Type Anes PTL Lv  5 Current           4 Term 08/27/22 [redacted]w[redacted]d 01:15 / 00:24 2330 g M Vag-Spont None  LIV  3 Term 07/03/21 [redacted]w[redacted]d 17:20 / 00:01 2690 g M Vag-Spont None  LIV  2 Preterm 04/13/17 [redacted]w[redacted]d  2041 g M Vag-Spont   LIV  1 Term 06/30/12 [redacted]w[redacted]d  3289 g M Vag-Spont   LIV   Past Surgical History:  Procedure Laterality Date   NO PAST SURGERIES     Family History  Problem Relation Age of Onset   Anemia Mother    Diabetes Neg Hx    Hypertension Neg Hx    Social History   Tobacco Use   Smoking status: Never   Smokeless tobacco: Never  Vaping Use   Vaping status: Never Used  Substance Use Topics   Alcohol use: Not Currently   Drug use: Never   No Known Allergies Medications Prior to Admission  Medication Sig Dispense Refill Last Dose/Taking   acetaminophen  (TYLENOL ) 325 MG tablet Take 2 tablets (650 mg total) by mouth every 4 (four) hours as needed (for pain scale < 4). 100 tablet 0 12/26/2023   aspirin 81 MG chewable tablet Chew 81 mg by mouth daily.   Past Month   cyclobenzaprine  (FLEXERIL ) 10 MG tablet Take 1 tablet (10 mg total) by mouth every 8 (eight) hours as needed  for muscle spasms. 20 tablet 0 Past Month   Prenatal Vit-Fe Fumarate-FA (PRENATAL MULTIVITAMIN) TABS tablet Take 1 tablet by mouth daily at 12 noon.   12/26/2023    I have reviewed patient's Past Medical Hx, Surgical Hx, Family Hx, Social Hx, medications and allergies.   ROS  Pertinent items noted in HPI and remainder of comprehensive ROS otherwise negative.   PHYSICAL EXAM  Patient Vitals for the past 24 hrs:  BP Temp Pulse Resp SpO2 Height Weight  12/27/23 1850 -- -- -- -- 99 % -- --  12/27/23 1848 125/81 -- 64 16 -- -- --  12/27/23 1734 123/80 98.6 F (37 C) 72 18 -- 4' 10 (1.473 m) 59 kg    Constitutional: Well-developed, well-nourished female in no acute distress.  Cardiovascular: normal rate & rhythm, warm and well-perfused Respiratory: normal effort, no problems with respiration noted GI: Abd soft, non-tender, gravid MS: Extremities nontender, no edema, normal ROM Neurologic: Alert and oriented x 4.  GU: no CVA tenderness Pelvic: NEFG, physiologic discharge, no blood, cervix clean.   Dilation: 5 Effacement (%): 60 Station: -3 Exam by:: Olam Dalton, NP  (  Cervical Exam unchanged @ 2033 although bloody show is present)  Fetal Tracing: ( Cat 1 reactive) Baseline: 140 Variability: moderate  Accelerations: present Decelerations: absent Toco: irregular contractions q 20-30 minutes    Labs: Results for orders placed or performed during the hospital encounter of 12/27/23 (from the past 24 hours)  Wet prep, genital     Status: Abnormal   Collection Time: 12/27/23  6:47 PM  Result Value Ref Range   Yeast Wet Prep HPF POC NONE SEEN NONE SEEN   Trich, Wet Prep NONE SEEN NONE SEEN   Clue Cells Wet Prep HPF POC PRESENT (A) NONE SEEN   WBC, Wet Prep HPF POC >=10 (A) <10   Sperm NONE SEEN   Group B strep by PCR     Status: None   Collection Time: 12/27/23  6:47 PM   Specimen: Genital  Result Value Ref Range   Group B strep by PCR PRESUMPTIVE NEGATIVE PRESUMPTIVE NEGATIVE   Fern Test     Status: None   Collection Time: 12/27/23  6:55 PM  Result Value Ref Range   POCT Fern Test Negative = intact amniotic membranes     Imaging:  US  MFM OB FOLLOW UP Result Date: 12/27/2023 ----------------------------------------------------------------------  OBSTETRICS REPORT                       (Signed Final 12/27/2023 04:41 pm) ---------------------------------------------------------------------- Patient Info  ID #:       [id number]                          D.O.B.:  1995-08-02 (28 yrs)(F)  Name:       Carolyn Carroll          Visit Date: 12/27/2023 03:53 pm              Carolyn Carroll ---------------------------------------------------------------------- Performed By  Attending:        Delora Smaller DO       Ref. Address:     [address]                                                             [place], [place]                                                             [address]  Performed By:     Elenor Edu BS      [facility]  Referred By:      GLORIS DELENA HUGGER MD ---------------------------------------------------------------------- Orders  #  Description                           Code        Ordered By  1  US  MFM OB FOLLOW UP                   76816.01    YU FANG  2  US  MFM FETAL BPP WO NON               76819.01    YU FANG     STRESS ----------------------------------------------------------------------  #  Order #                     Accession #                Episode #  1  [id number]                   [id number]                 [id number]  2  [id number]                   [id number]                 [id number] ---------------------------------------------------------------------- Indications  [redacted] weeks gestation of pregnancy                 Z3A.37  Large for gestational age fetus affecting      O36.60X0  management of mother (resolved)  Poor obstetric history: Previous preterm       O09.219  delivery, antepartum (28w, 2 term vaginal  deliveries since)  Short interval between pregnancies, 3rd        O09.893  trimester (delivered 08/27/22)  Poor obstetric history: Previous fetal growth  O09.299  restriction (FGR)  LR NIPS - Female, Negative Horizon,  Negative AFP  Encounter for other antenatal screening        Z36.2  follow-up ---------------------------------------------------------------------- Fetal Evaluation  Num Of Fetuses:         1  Fetal Heart Rate(bpm):  143  Cardiac Activity:       Observed  Presentation:           Cephalic  Placenta:               Posterior  P. Cord Insertion:      Previously seen  Amniotic Fluid  AFI FV:      Lower end of normal  AFI Sum(cm)     %Tile       Largest Pocket(cm)  5.46            < 3         3.47  RUQ(cm)       RLQ(cm)       LUQ(cm)  LLQ(cm)  0             3.47          0              1.99 ---------------------------------------------------------------------- Biophysical Evaluation  Amniotic F.V:   Pocket => 2 cm             F. Tone:        Observed  F. Movement:    Observed                   Score:          8/8  F. Breathing:   Observed ---------------------------------------------------------------------- Biometry  BPD:      84.4  mm     G. Age:  34w 0d        2.6  %    CI:        72.73   %    70 - 86                                                          FL/HC:      23.5   %    20.8 - 22.6  HC:      314.7  mm     G. Age:  35w 2d        2.6  %    HC/AC:      0.92        0.92 - 1.05  AC:      340.9  mm     G. Age:  38w 0d         84  %    FL/BPD:     87.6   %    71 - 87  FL:       73.9  mm     G. Age:  37w 6d         67  %    FL/AC:      21.7   %    20 - 24  Est. FW:    3129  gm    6 lb 14 oz      57  % ---------------------------------------------------------------------- OB History  Gravidity:    5          Term:   3        Prem:   1        SAB:   0  TOP:          0       Ectopic:  0        Living: 4 ---------------------------------------------------------------------- Gestational Age  LMP:           37w 1d        Date:  04/11/23                   EDD:   01/16/24  U/S Today:     36w 2d                                        EDD:   01/22/24  Best:          37w 1d  Det. By:  LMP  (04/11/23)          EDD:   01/16/24 ---------------------------------------------------------------------- Anatomy  Cranium:               Appears normal         Aortic Arch:            Previously seen  Cavum:                 Previously seen        Ductal Arch:            Previously seen  Ventricles:            Appears normal         Diaphragm:              Appears normal  Choroid Plexus:        Previously seen        Stomach:                Appears normal, left                                                                        sided  Cerebellum:            Previously seen        Abdomen:                Previously seen  Posterior Fossa:       Previously seen        Abdominal Wall:         Previously seen  Face:                  Orbits and profile     Cord Vessels:           Previously seen                         previously seen  Lips:                  Previously seen        Kidneys:                Appear normal  Thoracic:              Previously seen        Bladder:                Appears normal  Heart:                 Previously seen        Spine:                  Previously seen  RVOT:                  Previously seen        Upper Extremities:      Previously seen  LVOT:                  Previously seen        Lower Extremities:      Previously seen ---------------------------------------------------------------------- Cervix  Uterus Adnexa  Cervix  Not visualized (advanced GA >24wks)  Uterus  No abnormality visualized.  Right Ovary  Not visualized.  Left Ovary  Not visualized.  Cul De Sac  No free fluid seen.  Adnexa  No  abnormality visualized ---------------------------------------------------------------------- Comments  Maternal Fetal medicine Consult  Carolyn Carroll is a 28 y.o. [id number] at  [redacted]w[redacted]d here for ultrasound and consultation. Carolyn LILIANA  KAYBREE Carroll is doing well today. Today we focused on  the following:  The patient was here for a follow-up growth ultrasound due to  having a large for gestational age fetus.  Today the EFW is at  the 57 percentile.  The amniotic fluid was noted to be low.  The patient reports that her water may have broke 3 days  ago.  I discussed the importance of going to the hospital for  evaluation.  She will go promptly to the MAU for assessment.  I have notified the on-call provider who will await her arrival.  The patient had time to ask questions that were answered to  her satisfaction.  She verbalized understanding and agrees to  proceed with the plan below.  Sonographic findings  Single intrauterine pregnancy.  Fetal cardiac activity: Observed.  Presentation: Cephalic.  Interval fetal anatomy appears normal.  Fetal biometry shows the estimated fetal weight at the 57  percentile.  Amniotic fluid: Lower end of normal.  MVP: 3.47 cm.  Placenta: Posterior.  BPP: 8/8.  There are limitations of prenatal ultrasound such as the  inability to detect certain abnormalities due to poor  visualization. Various factors such as fetal position,  gestational age and maternal body habitus may increase the  difficulty in visualizing the fetal anatomy.  Recommendations  - Sent to MAU for labor rule out.  If the patient is not in labor  then a weekly BPP should be done due to the lower end of  normal fluid.  Low threshold to admit for delivery given the  possibility of rupture membranes at term.  An interpreter with the [phone] was used for today's visit on  the [employer] interpreter. ----------------------------------------------------------------------                   Delora Smaller, DO  Electronically Signed Final Report   12/27/2023 04:41 pm ----------------------------------------------------------------------   US  MFM FETAL BPP WO NON STRESS Result Date: 12/27/2023 ----------------------------------------------------------------------  OBSTETRICS REPORT                       (Signed Final 12/27/2023 04:41 pm) ---------------------------------------------------------------------- Patient Info  ID #:       [id number]                          D.O.B.:  [birth year] (28 yrs)(F)  Name:       Carolyn Carroll          Visit Date: 12/27/2023 03:53 pm              Carolyn Carroll ---------------------------------------------------------------------- Performed By  Attending:        Delora Smaller DO       Ref. Address:     [address]  [place], [place]                                                             [address]  Performed By:     Elenor Edu BS      Location[facility]                                 [facility]                                                             [facility]  Referred By:      GLORIS DELENA HUGGER MD ---------------------------------------------------------------------- Orders  #  Description                           Code        Ordered By  1  US  MFM OB FOLLOW UP                   76816.01    YU FANG  2  US  MFM FETAL BPP WO NON               76819.01    YU FANG     STRESS ----------------------------------------------------------------------  #  Order #                     Accession #                Episode #  1  [id number]                   [id number]                 [id number]  2  [id number]                   [id number]                 [id number] ---------------------------------------------------------------------- Indications  [redacted] weeks gestation of pregnancy                Z3A.37  Large for  gestational age fetus affecting      O36.60X0  management of mother (resolved)  Poor obstetric history: Previous preterm       O09.219  delivery, antepartum (28w, 2 term vaginal  deliveries since)  Short interval between pregnancies, 3rd        O09.893  trimester (delivered 08/27/22)  Poor obstetric history: Previous fetal growth  O09.299  restriction (FGR)  LR NIPS - Female, Negative Horizon,  Negative AFP  Encounter for other antenatal screening        Z36.2  follow-up ---------------------------------------------------------------------- Fetal Evaluation  Num Of Fetuses:         1  Fetal Heart Rate(bpm):  143  Cardiac Activity:       Observed  Presentation:           Cephalic  Placenta:               Posterior  P. Cord Insertion:      Previously seen  Amniotic Fluid  AFI FV:      Lower end of normal  AFI Sum(cm)     %Tile       Largest Pocket(cm)  5.46            < 3         3.47  RUQ(cm)       RLQ(cm)       LUQ(cm)        LLQ(cm)  0             3.47          0              1.99 ---------------------------------------------------------------------- Biophysical Evaluation  Amniotic F.V:   Pocket => 2 cm             F. Tone:        Observed  F. Movement:    Observed                   Score:          8/8  F. Breathing:   Observed ---------------------------------------------------------------------- Biometry  BPD:      84.4  mm     G. Age:  34w 0d        2.6  %    CI:        72.73   %    70 - 86                                                          FL/HC:      23.5   %    20.8 - 22.6  HC:      314.7  mm     G. Age:  35w 2d        2.6  %    HC/AC:      0.92        0.92 - 1.05  AC:      340.9  mm     G. Age:  38w 0d         84  %    FL/BPD:     87.6   %    71 - 87  FL:       73.9  mm     G. Age:  37w 6d         67  %    FL/AC:      21.7   %    20 - 24  Est. FW:  3129  gm    6 lb 14 oz      57  % ---------------------------------------------------------------------- OB History  Gravidity:    5         Term:   3         Prem:   1        SAB:   0  TOP:          0       Ectopic:  0        Living: 4 ---------------------------------------------------------------------- Gestational Age  LMP:           37w 1d        Date:  04/11/23                   EDD:   01/16/24  U/S Today:     36w 2d                                        EDD:   01/22/24  Best:          37w 1d     Det. By:  LMP  (04/11/23)          EDD:   01/16/24 ---------------------------------------------------------------------- Anatomy  Cranium:               Appears normal         Aortic Arch:            Previously seen  Cavum:                 Previously seen        Ductal Arch:            Previously seen  Ventricles:            Appears normal         Diaphragm:              Appears normal  Choroid Plexus:        Previously seen        Stomach:                Appears normal, left                                                                        sided  Cerebellum:            Previously seen        Abdomen:                Previously seen  Posterior Fossa:       Previously seen        Abdominal Wall:         Previously seen  Face:                  Orbits and profile     Cord Vessels:           Previously seen                         previously seen  Lips:                  Previously seen        Kidneys:                Appear normal  Thoracic:              Previously seen        Bladder:                Appears normal  Heart:                 Previously seen        Spine:                  Previously seen  RVOT:                  Previously seen        Upper Extremities:      Previously seen  LVOT:                  Previously seen        Lower Extremities:      Previously seen ---------------------------------------------------------------------- Cervix Uterus Adnexa  Cervix  Not visualized (advanced GA >24wks)  Uterus  No abnormality visualized.  Right Ovary  Not visualized.  Left Ovary  Not visualized.  Cul De Sac  No free fluid seen.  Adnexa  No abnormality visualized  ---------------------------------------------------------------------- Comments  Maternal Fetal medicine Consult  Carolyn Carroll is a 28 y.o. [id number] at  [redacted]w[redacted]d here for ultrasound and consultation. Carolyn LILIANA  GAVRIELLE Carroll is doing well today. Today we focused on  the following:  The patient was here for a follow-up growth ultrasound due to  having a large for gestational age fetus.  Today the EFW is at  the 57 percentile.  The amniotic fluid was noted to be low.  The patient reports that her water may have broke 3 days  ago.  I discussed the importance of going to the hospital for  evaluation.  She will go promptly to the MAU for assessment.  I have notified the on-call provider who will await her arrival.  The patient had time to ask questions that were answered to  her satisfaction.  She verbalized understanding and agrees to  proceed with the plan below.  Sonographic findings  Single intrauterine pregnancy.  Fetal cardiac activity: Observed.  Presentation: Cephalic.  Interval fetal anatomy appears normal.  Fetal biometry shows the estimated fetal weight at the 57  percentile.  Amniotic fluid: Lower end of normal.  MVP: 3.47 cm.  Placenta: Posterior.  BPP: 8/8.  There are limitations of prenatal ultrasound such as the  inability to detect certain abnormalities due to poor  visualization. Various factors such as fetal position,  gestational age and maternal body habitus may increase the  difficulty in visualizing the fetal anatomy.  Recommendations  - Sent to MAU for labor rule out.  If the patient is not in labor  then a weekly BPP should be done due to the lower end of  normal fluid.  Low threshold to admit for delivery given the  possibility of rupture membranes at term.  An interpreter with the [phone] was used for today's visit on  the [employer] interpreter. ----------------------------------------------------------------------                   Delora Smaller, DO Electronically Signed Final  Report   12/27/2023 04:41 pm ----------------------------------------------------------------------    MDM & MAU COURSE  MDM:  HIGH  Prenatal chart reviewed Physical exam performed with pelvic Negative fern slide /ROM plus collected ( Notified by the lab @ 2000 - unable to run ROM Plus due to mucoid discharge) and unable to repeat due to lubrication used to perform cervical exam Vaginal swabs ( Wet prep c/w Clue Cells) Will plan to treat for BV at discharge NST for gestational age and fetal reassurance (Cat 1 reactive)   D/W DR Izell ( OB MAU Attending) Stable for discharge /No evidence of active labor at this time Will plan for weekly BPP's per MFM Note ( Message sent to office for scheduling)  MAU Course: Orders Placed This Encounter  Procedures   Wet prep, genital   Group B strep by PCR   Fern Test   Discharge patient Discharge disposition: 01-Home or Self Care; Discharge patient date: 12/27/2023   Discharge patient Discharge disposition: 01-Home or Self Care; Discharge patient date: 12/27/2023   Meds ordered this encounter  Medications   metroNIDAZOLE (FLAGYL) 500 MG tablet    Sig: Take 1 tablet (500 mg total) by mouth 2 (two) times daily.    Dispense:  14 tablet    Refill:  0    Supervising Provider:   PRATT, TANYA S [[id number]]    I have reviewed the patient chart and performed the physical exam . I have ordered & interpreted the lab results and reviewed and interpreted the NST Medications ordered as stated below.  A/P as described below.  Counseling and education provided and patient agreeable  with plan as described below. Verbalized understanding.    ASSESSMENT   1. AFI (amniotic fluid index) borderline low   2. [redacted] weeks gestation of pregnancy   3. Irregular uterine contractions   4. NST (non-stress test) reactive on fetal surveillance   5. Language barrier     PLAN  Discharge home in stable condition with return precautions.   Weekly BPP's per MFM-message  sent to office for scheduling  See AVS for full description of information given to the patient including both verbal and written. Patient verbalized understanding and agrees with the plan as described above.     Allergies as of 12/27/2023   No Known Allergies      Medication List     TAKE these medications    acetaminophen  325 MG tablet Commonly known as: Tylenol  Take 2 tablets (650 mg total) by mouth every 4 (four) hours as needed (for pain scale < 4).   aspirin 81 MG chewable tablet Chew 81 mg by mouth daily.   cyclobenzaprine  10 MG tablet Commonly known as: FLEXERIL  Take 1 tablet (10 mg total) by mouth every 8 (eight) hours as needed for muscle spasms.   metroNIDAZOLE 500 MG tablet Commonly known as: FLAGYL Take 1 tablet (500 mg total) by mouth 2 (two) times daily.   prenatal multivitamin Tabs tablet Take 1 tablet by mouth daily at 12 noon.        Olam Dalton, MSN, [facility], [facility]    This chart was dictated using voice recognition software, Dragon. Despite the best efforts of this provider to proofread and correct errors, errors may still occur which can change documentation meaning.

## 2023-12-28 ENCOUNTER — Encounter (HOSPITAL_COMMUNITY): Payer: Self-pay | Admitting: Obstetrics and Gynecology

## 2023-12-28 LAB — CBC
HCT: 38.3 % (ref 36.0–46.0)
Hemoglobin: 11.8 g/dL — ABNORMAL LOW (ref 12.0–15.0)
MCH: 23.3 pg — ABNORMAL LOW (ref 26.0–34.0)
MCHC: 30.8 g/dL (ref 30.0–36.0)
MCV: 75.7 fL — ABNORMAL LOW (ref 80.0–100.0)
Platelets: 245 K/uL (ref 150–400)
RBC: 5.06 MIL/uL (ref 3.87–5.11)
RDW: 14.7 % (ref 11.5–15.5)
WBC: 17 K/uL — ABNORMAL HIGH (ref 4.0–10.5)
nRBC: 0 % (ref 0.0–0.2)

## 2023-12-28 LAB — TYPE AND SCREEN
ABO/RH(D): O POS
Antibody Screen: NEGATIVE

## 2023-12-28 LAB — RPR: RPR Ser Ql: NONREACTIVE

## 2023-12-28 LAB — HIV ANTIBODY (ROUTINE TESTING W REFLEX): HIV Screen 4th Generation wRfx: NONREACTIVE

## 2023-12-28 MED ORDER — ACETAMINOPHEN 325 MG PO TABS
650.0000 mg | ORAL_TABLET | ORAL | Status: DC | PRN
  Administered 2023-12-28 (×2): 650 mg via ORAL
  Filled 2023-12-28 (×2): qty 2

## 2023-12-28 MED ORDER — BENZOCAINE-MENTHOL 20-0.5 % EX AERO: 1.0000 | INHALATION_SPRAY | CUTANEOUS | Status: DC | PRN

## 2023-12-28 MED ORDER — OXYCODONE HCL 5 MG PO TABS
5.0000 mg | ORAL_TABLET | Freq: Three times a day (TID) | ORAL | Status: DC | PRN
  Administered 2023-12-28: 5 mg via ORAL
  Filled 2023-12-28: qty 1

## 2023-12-28 MED ORDER — ONDANSETRON HCL 4 MG PO TABS: 4.0000 mg | ORAL_TABLET | ORAL | Status: DC | PRN

## 2023-12-28 MED ORDER — WITCH HAZEL-GLYCERIN EX PADS: 1.0000 | MEDICATED_PAD | CUTANEOUS | Status: DC | PRN

## 2023-12-28 MED ORDER — SENNOSIDES-DOCUSATE SODIUM 8.6-50 MG PO TABS
2.0000 | ORAL_TABLET | Freq: Every day | ORAL | Status: DC
  Administered 2023-12-28 – 2023-12-29 (×2): 2 via ORAL
  Filled 2023-12-28 (×2): qty 2

## 2023-12-28 MED ORDER — TETANUS-DIPHTH-ACELL PERTUSSIS 5-2-15.5 LF-MCG/0.5 IM SUSP
0.5000 mL | Freq: Once | INTRAMUSCULAR | Status: AC
  Administered 2023-12-29: 0.5 mL via INTRAMUSCULAR
  Filled 2023-12-28: qty 0.5

## 2023-12-28 MED ORDER — DIBUCAINE (PERIANAL) 1 % EX OINT: 1.0000 | TOPICAL_OINTMENT | CUTANEOUS | Status: DC | PRN

## 2023-12-28 MED ORDER — DIPHENHYDRAMINE HCL 25 MG PO CAPS: 25.0000 mg | ORAL_CAPSULE | Freq: Four times a day (QID) | ORAL | Status: DC | PRN

## 2023-12-28 MED ORDER — COCONUT OIL OIL: 1.0000 | TOPICAL_OIL | Status: DC | PRN

## 2023-12-28 MED ORDER — IBUPROFEN 600 MG PO TABS
600.0000 mg | ORAL_TABLET | Freq: Four times a day (QID) | ORAL | Status: DC
  Administered 2023-12-28 – 2023-12-29 (×6): 600 mg via ORAL
  Filled 2023-12-28 (×6): qty 1

## 2023-12-28 MED ORDER — PRENATAL MULTIVITAMIN CH
1.0000 | ORAL_TABLET | Freq: Every day | ORAL | Status: DC
  Administered 2023-12-28 – 2023-12-29 (×2): 1 via ORAL
  Filled 2023-12-28 (×2): qty 1

## 2023-12-28 MED ORDER — SIMETHICONE 80 MG PO CHEW: 80.0000 mg | CHEWABLE_TABLET | ORAL | Status: DC | PRN

## 2023-12-28 MED ORDER — ONDANSETRON HCL 4 MG/2ML IJ SOLN: 4.0000 mg | INTRAMUSCULAR | Status: DC | PRN

## 2023-12-28 NOTE — Discharge Summary (Signed)
 Postpartum Discharge Summary  Date of Service updated-10/25     Patient Name: Carolyn Carroll DOB: [birth year] MRN: [id number]  Date of admission: 12/27/2023 Delivery date:12/27/2023 Delivering provider: NEWTON MERING Date of discharge: 12/29/2023  Admitting diagnosis: Supervision of normal pregnancy in third trimester [Z34.93] Intrauterine pregnancy: [redacted]w[redacted]d     Secondary diagnosis:  Principal Problem:   Supervision of normal pregnancy in third trimester Active Problems:   Vaginal delivery  Additional problems: none    Discharge diagnosis: Term Pregnancy Delivered                                              Post partum procedures:none Augmentation: none Complications: None  Hospital course: Onset of Labor With Vaginal Delivery      28 y.o. yo [id number] at [redacted]w[redacted]d was admitted in Active Labor on 12/27/2023. Labor course was complicated by nothing  Membrane Rupture Time/Date:  ,   Delivery Method:Vaginal, Spontaneous Operative Delivery:N/A Episiotomy: None Lacerations:  None Patient had a postpartum course has been uncomplicated.  She is ambulating, tolerating a regular diet, passing flatus, and urinating well. Patient is discharged home in stable condition on 12/29/23.  Newborn Data: Birth date:12/27/2023 Birth time:11:18 PM Gender:Female Living status:Living Apgars:8 ,9  [phone] g  Magnesium  Sulfate received: No BMZ received: No Rhophylac:N/A MMR:N/A T-DaP:Given postpartum Flu: No RSV Vaccine received: No Transfusion:No  Immunizations received: Immunization History  Administered Date(s) Administered   Tdap 12/29/2023    Physical exam  Vitals:   12/28/23 0953 12/28/23 1603 12/28/23 2020 12/29/23 0554  BP: 111/62 109/72 115/66 117/83  Pulse: 60 64 63 60  Resp: 17 18 18 17   Temp: 98.3 F (36.8 C) (!) 97.5 F (36.4 C) 98.4 F (36.9 C) 98 F (36.7 C)  TempSrc: Oral Oral Oral Oral  SpO2: 99% 99%     General: alert,  cooperative, and no distress CV: RRR Lungs: CTAB Lochia: appropriate Uterine Fundus: firm, below umbilicus Incision: N/A DVT Evaluation: No evidence of DVT seen on physical exam. Labs: Lab Results  Component Value Date   WBC 17.0 (H) 12/28/2023   HGB 11.8 (L) 12/28/2023   HCT 38.3 12/28/2023   MCV 75.7 (L) 12/28/2023   PLT 245 12/28/2023      Latest Ref Rng & Units 09/05/2023    9:22 PM  CMP  Glucose 70 - 99 mg/dL 889   BUN 6 - 20 mg/dL 8   Creatinine 9.55 - 8.99 mg/dL 9.40   Sodium 864 - 854 mmol/L 134   Potassium 3.5 - 5.1 mmol/L 3.4   Chloride 98 - 111 mmol/L 105   CO2 22 - 32 mmol/L 19   Calcium 8.9 - 10.3 mg/dL 8.5   Total Protein 6.5 - 8.1 g/dL 6.1   Total Bilirubin 0.0 - 1.2 mg/dL 0.2   Alkaline Phos 38 - 126 U/L 54   AST 15 - 41 U/L 14   ALT 0 - 44 U/L 12    Edinburgh Score:    12/28/2023    1:30 AM  Edinburgh Postnatal Depression Scale Screening Tool  I have been able to laugh and see the funny side of things. 0  I have looked forward with enjoyment to things. 0  I have blamed myself unnecessarily when things went wrong. 0  I have been anxious or worried for no good reason. 0  I have  felt scared or panicky for no good reason. 0  Things have been getting on top of me. 0  I have been so unhappy that I have had difficulty sleeping. 0  I have felt sad or miserable. 0  I have been so unhappy that I have been crying. 0  The thought of harming myself has occurred to me. 0  Edinburgh Postnatal Depression Scale Total 0   Edinburgh Postnatal Depression Scale Total: 0   After visit meds:  Allergies as of 12/29/2023   No Known Allergies      Medication List     STOP taking these medications    aspirin 81 MG chewable tablet       TAKE these medications    acetaminophen  325 MG tablet Commonly known as: Tylenol  Take 2 tablets (650 mg total) by mouth every 4 (four) hours as needed (for pain scale < 4).   cyclobenzaprine  10 MG tablet Commonly known  as: FLEXERIL  Take 1 tablet (10 mg total) by mouth every 8 (eight) hours as needed for muscle spasms.   ibuprofen  600 MG tablet Commonly known as: ADVIL  Take 1 tablet (600 mg total) by mouth every 6 (six) hours.   metroNIDAZOLE 500 MG tablet Commonly known as: FLAGYL Take 1 tablet (500 mg total) by mouth 2 (two) times daily.   oxyCODONE  5 MG immediate release tablet Commonly known as: Oxy IR/ROXICODONE  Take 1 tablet (5 mg total) by mouth every 8 (eight) hours as needed for breakthrough pain (pain scale 4-7).   prenatal multivitamin Tabs tablet Take 1 tablet by mouth daily at 12 noon.         Discharge home in stable condition Infant Feeding: Bottle and Breast Infant Disposition:home with mother Discharge instruction: per After Visit Summary and Postpartum booklet. Activity: Advance as tolerated. Pelvic rest for 6 weeks.  Diet: routine diet Future Appointments: Future Appointments  Date Time Provider Department Center  01/28/2024  2:35 PM Eveline Lynwood MATSU, MD [facility]   Follow up Visit:  Follow-up Information     [facility]. Go in 6 week(s).   Specialty: Obstetrics and Gynecology Why: Follow up in 5-6wk for a postpartum visit Contact information: [address] [place]   [address] [phone]                 Please schedule this patient for a In person postpartum visit in 4 weeks with the following provider: Any provider. Additional Postpartum F/U:  Low risk pregnancy complicated by: nothing Delivery mode:  Vaginal, Spontaneous Anticipated Birth Control:  Nexplanon   12/29/2023 Keary Hanak M Jackelin Correia, DO

## 2023-12-28 NOTE — Progress Notes (Addendum)
 POSTPARTUM PROGRESS NOTE  Subjective: Carolyn Carroll is a 28 y.o. [phone] s/p SVD at [redacted]w[redacted]d.  She reports she doing well. No acute events overnight. She denies any problems with ambulating, voiding or po intake. Denies nausea or vomiting. She has passed flatus. Pain is well controlled.  Lochia is minimal.  Objective: Blood pressure 108/72, pulse 62, temperature 98.4 F (36.9 C), temperature source Oral, resp. rate 16, last menstrual period 04/11/2023, SpO2 99%, unknown if currently breastfeeding.  Physical Exam:  General: alert, cooperative and no distress Chest: no respiratory distress Abdomen: soft, non-tender  Uterine Fundus: firm and at level of umbilicus Extremities: No calf swelling or tenderness  no edema  Recent Labs    12/28/23 0212  HGB 11.8*  HCT 38.3    Assessment/Plan: Carolyn Carroll is a 28 y.o. [phone] s/p SVD at [redacted]w[redacted]d .  Routine Postpartum Care: Doing well, pain well-controlled.  -- Continue routine care, lactation support  -- Contraception: Outpatient Nexplanon -- Feeding: breast and bottle  Dispo: Plan for discharge home when clinically ready.  Suzen Houston NOVAK, DO PGY1 Family Medicine Resident 12/28/2023 9:01 AM   Attestation of Supervision of Student:  I confirm that I have verified the information documented in the resident's note and that I have also personally reperformed the history, physical exam and all medical decision making activities.  I have verified that all services and findings are accurately documented in this student's note; and I agree with management and plan as outlined in the documentation. I have also made any necessary editorial changes.   Barkley LITTIE Angles, MD OB Fellow 12/28/2023 12:19 PM

## 2023-12-28 NOTE — H&P (Signed)
 Carolyn Carroll Carolyn Carroll is a 28 y.o. female [phone] with IUP at [redacted]w[redacted]d by LMP presenting for contractions.  She reports positive fetal movement.  Prenatal History/Complications:  Seen by MFM today as F/U for previously suspected LGA, today 57%.  AFI was borderline low and pt c/o leaking for 3 days.  She presented to MAU for evaluation of ROM.  Carolyn Carroll was negative, pt sent home.  She returned a few hours later, completely dilated.    [facility]NC at [facility], w/[facility] transfer at 24 weeks    Past Medical History: Past Medical History:  Diagnosis Date   Medical history non-contributory     Past Surgical History: Past Surgical History:  Procedure Laterality Date   NO PAST SURGERIES      Obstetrical History: OB History     Gravida  5   Para  4   Term  3   Preterm  1   AB      Living  4      SAB      IAB      Ectopic      Multiple  0   Live Births  4            Social History: Social History   Socioeconomic History   Marital status: Significant Other    Spouse name: Not on file   Number of children: Not on file   Years of education: Not on file   Highest education level: Not on file  Occupational History   Not on file  Tobacco Use   Smoking status: Never   Smokeless tobacco: Never  Vaping Use   Vaping status: Never Used  Substance and Sexual Activity   Alcohol use: Not Currently   Drug use: Never   Sexual activity: Yes    Birth control/protection: None  Other Topics Concern   Not on file  Social History Narrative   Not on file   Social Drivers of Health   Financial Resource Strain: Not on file  Food Insecurity: No Food Insecurity (12/27/2023)   Hunger Vital Sign    Worried About Running Out of Food in the Last Year: Never true    Ran Out of Food in the Last Year: Never true  Transportation Needs: No Transportation Needs (12/27/2023)   PRAPARE - Administrator, Civil Service (Medical): No    Lack of Transportation (Non-Medical): No   Physical Activity: Not on file  Stress: Not on file  Social Connections: Not on file    Family History: Family History  Problem Relation Age of Onset   Anemia Mother    Diabetes Neg Hx    Hypertension Neg Hx     Allergies: No Known Allergies  Medications Prior to Admission  Medication Sig Dispense Refill Last Dose/Taking   acetaminophen  (TYLENOL ) 325 MG tablet Take 2 tablets (650 mg total) by mouth every 4 (four) hours as needed (for pain scale < 4). 100 tablet 0    aspirin 81 MG chewable tablet Chew 81 mg by mouth daily.      cyclobenzaprine  (FLEXERIL ) 10 MG tablet Take 1 tablet (10 mg total) by mouth every 8 (eight) hours as needed for muscle spasms. 20 tablet 0    metroNIDAZOLE (FLAGYL) 500 MG tablet Take 1 tablet (500 mg total) by mouth 2 (two) times daily. 14 tablet 0    Prenatal Vit-Fe Fumarate-FA (PRENATAL MULTIVITAMIN) TABS tablet Take 1 tablet by mouth daily at 12 noon.  Review of Systems   Constitutional: Negative for fever and chills Eyes: Negative for visual disturbances Respiratory: Negative for shortness of breath, dyspnea Cardiovascular: Negative for chest pain or palpitations  Gastrointestinal: Negative for vomiting, diarrhea and constipation.  POSITIVE for abdominal pain (contractions) Genitourinary: Negative for dysuria and urgency Musculoskeletal: Negative for back pain, joint pain, myalgias  Neurological: Negative for dizziness and headaches  Blood pressure (!) 113/91, pulse 62, temperature 97.9 F (36.6 C), temperature source Oral, resp. rate 18, last menstrual period 04/11/2023, SpO2 100%, unknown if currently breastfeeding. General appearance: alert, cooperative, and mild distress Lungs: normal respiratory effort Heart: regular rate and rhythm Abdomen: soft, non-tender; bowel sounds normal Extremities: Homans sign is negative, no sign of DVT DTR's 2+ Presentation: cephalic Fetal monitoring  Baseline: 140 bpm, Variability: Good {> 6 bpm),  Accelerations: Reactive, and Decelerations: Variable: mild Uterine activity  q 2 minutes Dilation: 10 Effacement (%): 100 Exam by:: Carolyn Gravel, RN   Prenatal labs:  NURSING  PROVIDER  Office Location Gchd>mcw Dating by LMP  [facility] Model Centering Anatomy U/S   Initiated care at  [employer]  Spanish              LAB RESULTS   Support Person Husband  Genetics NIPS: none. Quad neg AFP:     NT/IT (FT only)     Carrier Screen Horizon:   Rhogam    A1C/GTT Early HgbA1C: early 1h neg Third trimester 2 hr GTT: neg  Flu Vaccine     TDaP Vaccine   Blood Type  O Positive   RSV Vaccine  Antibody  Negative   COVID Vaccine  Rubella  Immune  Feeding Plan breast RPR Nonreactive (06/02 0000)  Contraception Yes, Nexplanon  HBsAg  neg  Circumcision Girl  HIV Non-reactive (06/02 0000)  Pediatrician   HCVAb Negative (06/02 0000)  Prenatal Classes     BTL Consent  Pap Neg 06/06/2022  BTL Pre-payment  GC/CT Initial:  neg 36wks:    VBAC Consent  GBS   For PCN allergy, check sensitivities   BRx Optimized? [ ]  yes   [ ]  no    DME Rx [ ]  BP cuff [ ]  Weight Scale Waterbirth  [ ]  Class [ ]  Consent [ ]  CNM visit  PHQ9 & GAD7 [  ] new OB [  ] 28 weeks  [  ] 36 weeks Induction  [ ]  Orders Entered [ ] Foley Y/N    Prenatal Transfer Tool  Maternal Diabetes: No Genetic Screening: Normal Maternal Ultrasounds/Referrals: Normal Fetal Ultrasounds or other Referrals:  None Maternal Substance Abuse:  No Significant Maternal Medications:  None Significant Maternal Lab Results: Group B Strep negative Number of Prenatal Visits:greater than 3 verified prenatal visits Maternal Vaccinations: Other Comments:    Results for orders placed or performed during the hospital encounter of 12/27/23 (from the past 24 hours)  Wet prep, genital   Collection Time: 12/27/23  6:47 PM  Result Value Ref Range   Yeast Wet Prep HPF POC NONE SEEN NONE SEEN   Trich, Wet Prep NONE SEEN NONE SEEN   Clue Cells  Wet Prep HPF POC PRESENT (A) NONE SEEN   WBC, Wet Prep HPF POC >=10 (A) <10   Sperm NONE SEEN   Group B strep by PCR   Collection Time: 12/27/23  6:47 PM   Specimen: Genital  Result Value Ref Range   Group B  strep by PCR PRESUMPTIVE NEGATIVE PRESUMPTIVE NEGATIVE  Fern Test   Collection Time: 12/27/23  6:55 PM  Result Value Ref Range   POCT Fern Test Negative = intact amniotic membranes     Assessment: Carolyn Carroll is a 28 y.o. [phone] with an IUP at [redacted]w[redacted]d presenting for active labor,  prolonged ROM  Plan: #Labor: expectant management #Pain:  Per request #FWB Cat 1 #ID: GBS: neg  #MOF:  both #MOC: outpt nexplanon   Carolyn Carroll 12/28/2023, 12:13 AM

## 2023-12-28 NOTE — Discharge Summary (Incomplete)
 Postpartum Discharge Summary  Date of Service updated***     Patient Name: Carolyn Carroll DOB: 1995-03-07 MRN: [id number]  Date of admission: 12/27/2023 Delivery date:12/27/2023 Delivering provider: NEWTON MERING Date of discharge: 12/28/2023  Admitting diagnosis: Supervision of normal pregnancy in third trimester [Z34.93] Intrauterine pregnancy: [redacted]w[redacted]d     Secondary diagnosis:  Principal Problem:   Supervision of normal pregnancy in third trimester  Additional problems: ***    Discharge diagnosis: {DX.:23714}                                              Post partum procedures:{Postpartum procedures:23558} Augmentation: {Augmentation:20782} Complications: {OB Labor/Delivery Complications:20784}  Hospital course: {Courses:23701}  Magnesium  Sulfate received: {Mag received:30440022} BMZ received: {BMZ received:30440023} Rhophylac:{Rhophylac received:30440032} FFM:{FFM:69559966} T-DaP:{Tdap:23962} Flu: {Qol:76036} RSV Vaccine received: {RSV:31013} Transfusion:{Transfusion received:30440034}  Immunizations received: There is no immunization history for the selected administration types on file for this patient.  Physical exam  Vitals:   12/27/23 2334  BP: 131/69  Pulse: 67  Resp: 17  Temp: 97.9 F (36.6 C)  TempSrc: Oral   General: {Exam; general:21111117} Lochia: {Desc; appropriate/inappropriate:30686::appropriate} Uterine Fundus: {Desc; firm/soft:30687} Incision: {Exam; incision:21111123} DVT Evaluation: {Exam; dvt:2111122} Labs: Lab Results  Component Value Date   WBC 7.0 10/30/2023   HGB 11.4 10/30/2023   HCT 36.1 10/30/2023   MCV 81 10/30/2023   PLT 271 10/30/2023      Latest Ref Rng & Units 09/05/2023    9:22 PM  CMP  Glucose 70 - 99 mg/dL 889   BUN 6 - 20 mg/dL 8   Creatinine 9.55 - 8.99 mg/dL 9.40   Sodium 864 - 854 mmol/L 134   Potassium 3.5 - 5.1 mmol/L 3.4   Chloride 98 - 111 mmol/L 105   CO2 22 - 32 mmol/L 19    Calcium 8.9 - 10.3 mg/dL 8.5   Total Protein 6.5 - 8.1 g/dL 6.1   Total Bilirubin 0.0 - 1.2 mg/dL 0.2   Alkaline Phos 38 - 126 U/L 54   AST 15 - 41 U/L 14   ALT 0 - 44 U/L 12    Edinburgh Score:    08/28/2022   12:10 PM  Edinburgh Postnatal Depression Scale Screening Tool  I have been able to laugh and see the funny side of things. 0   I have looked forward with enjoyment to things. 0   I have blamed myself unnecessarily when things went wrong. 0   I have been anxious or worried for no good reason. 0   I have felt scared or panicky for no good reason. 0   Things have been getting on top of me. 0   I have been so unhappy that I have had difficulty sleeping. 0   I have felt sad or miserable. 0   I have been so unhappy that I have been crying. 0   The thought of harming myself has occurred to me. 0   Edinburgh Postnatal Depression Scale Total 0      Data saved with a previous flowsheet row definition   No data recorded  After visit meds:  Allergies as of 12/28/2023   No Known Allergies   Med Rec must be completed prior to using this [facility]***        Discharge home in stable condition Infant Feeding: {Baby feeding:23562} Infant Disposition:{CHL IP OB HOME  WITH FNUYZM:76418} Discharge instruction: per After Visit Summary and Postpartum booklet. Activity: Advance as tolerated. Pelvic rest for 6 weeks.  Diet: {OB ipzu:78888878} Future Appointments:No future appointments. Follow up Visit:   Please schedule this patient for a {Visit type:23955} postpartum visit in {Postpartum visit:23953} with the following provider: {Provider type:23954}. Additional Postpartum F/U:{PP Procedure:23957}  {Risk level:23960} pregnancy complicated by: {complication:23959} Delivery mode:  Vaginal, Spontaneous Anticipated Birth Control:  {Birth Control:23956}   12/28/2023 Cathlean Ely, CNM

## 2023-12-28 NOTE — Plan of Care (Signed)

## 2023-12-28 NOTE — Lactation Note (Signed)
 This note was copied from a baby's chart. Lactation Consultation Note  Patient Name: Carolyn Carroll Unijb'd Date: 12/28/2023 Age:29 hours Reason for consult: Initial assessment;Early term 37-38.6wks  P5 Shanda Rn interpret for [facility] while she was working w/mom. experienced BF mom states the baby has been BF very well. She prefers to BF verses formula feed. Mom continues to give the formula until her milk comes in because she doesn't feel like the colostrum is enough. Mom states she doesn't have any questions or concerns at this time. Mom denies needing assistance. Newborn feeding habits, STS, I&O supplementing reviewed. Encouraged mom if she has any questions or needs assistance to call. Mom has DEBP at home. Maternal Data Does the patient have breastfeeding experience prior to this delivery?: Yes How long did the patient breastfeed?: 1st-1 yr, 2nd 1 1/2 yr, 3rd & 4th for 4 months  Feeding Nipple Type: Slow - flow  LATCH Score Latch: Grasps breast easily, tongue down, lips flanged, rhythmical sucking.  Audible Swallowing: A few with stimulation  Type of Nipple: Everted at rest and after stimulation  Comfort (Breast/Nipple): Soft / non-tender  Hold (Positioning): No assistance needed to correctly position infant at breast.  LATCH Score: 9   Lactation Tools Discussed/Used    Interventions Interventions: Breast feeding basics reviewed;Education;LC Services brochure  Discharge Pump: DEBP  Consult Status Consult Status: Complete    [name] 12/28/2023, 6:33 AM

## 2023-12-28 NOTE — Lactation Note (Signed)
 This note was copied from a baby's chart. Lactation Consultation Note  Patient Name: Carolyn Carroll Unijb'd Date: 12/28/2023 Age:29 hours   Attempted to see mom. Mom awake but dad sleeping who interpret for her. Asked mom to call for Lactation when he wakes up when she BF Lactation wants to see. Mom spoke some English was telling me things. Mom is latching then giving formula because no milk. Mom stated she has 4 boys now her 1st Carolyn.  Maternal Data    Feeding Nipple Type: Slow - flow  LATCH Score                    Lactation Tools Discussed/Used    Interventions    Discharge    Consult Status      Jayvyn Haselton G 12/28/2023, 4:08 AM

## 2023-12-29 ENCOUNTER — Encounter (HOSPITAL_COMMUNITY): Payer: Self-pay | Admitting: Obstetrics and Gynecology

## 2023-12-29 ENCOUNTER — Other Ambulatory Visit: Payer: Self-pay

## 2023-12-29 MED ORDER — INFLUENZA VIRUS VACC SPLIT PF (FLUZONE) 0.5 ML IM SUSY
0.5000 mL | PREFILLED_SYRINGE | INTRAMUSCULAR | Status: AC
Start: 2023-12-30 — End: 2023-12-29
  Administered 2023-12-29: 0.5 mL via INTRAMUSCULAR
  Filled 2023-12-29: qty 0.5

## 2023-12-29 MED ORDER — OXYCODONE HCL 5 MG PO TABS: 5.0000 mg | ORAL_TABLET | Freq: Three times a day (TID) | ORAL | 0 refills | Status: AC | PRN

## 2023-12-29 MED ORDER — IBUPROFEN 600 MG PO TABS: 600.0000 mg | ORAL_TABLET | Freq: Four times a day (QID) | ORAL | 0 refills | Status: AC

## 2023-12-29 NOTE — Plan of Care (Signed)
  Problem: Education: Goal: Knowledge of General Education information will improve Description: Including pain rating scale, medication(s)/side effects and non-pharmacologic comfort measures Outcome: Completed/Met   Problem: Health Behavior/Discharge Planning: Goal: Ability to manage health-related needs will improve Outcome: Completed/Met   Problem: Clinical Measurements: Goal: Ability to maintain clinical measurements within normal limits will improve Outcome: Completed/Met Goal: Will remain free from infection Outcome: Completed/Met Goal: Diagnostic test results will improve Outcome: Completed/Met Goal: Respiratory complications will improve Outcome: Completed/Met Goal: Cardiovascular complication will be avoided Outcome: Completed/Met   Problem: Activity: Goal: Risk for activity intolerance will decrease Outcome: Completed/Met   Problem: Nutrition: Goal: Adequate nutrition will be maintained Outcome: Completed/Met   Problem: Coping: Goal: Level of anxiety will decrease Outcome: Completed/Met   Problem: Elimination: Goal: Will not experience complications related to bowel motility Outcome: Completed/Met Goal: Will not experience complications related to urinary retention Outcome: Completed/Met   Problem: Pain Managment: Goal: General experience of comfort will improve and/or be controlled Outcome: Completed/Met   Problem: Safety: Goal: Ability to remain free from injury will improve Outcome: Completed/Met   Problem: Skin Integrity: Goal: Risk for impaired skin integrity will decrease Outcome: Completed/Met   Problem: Education: Goal: Knowledge of condition will improve Outcome: Completed/Met Goal: Individualized Educational Video(s) Outcome: Completed/Met Goal: Individualized Newborn Educational Video(s) Outcome: Completed/Met   Problem: Activity: Goal: Will verbalize the importance of balancing activity with adequate rest periods Outcome:  Completed/Met Goal: Ability to tolerate increased activity will improve Outcome: Completed/Met   Problem: Coping: Goal: Ability to identify and utilize available resources and services will improve Outcome: Completed/Met   Problem: Life Cycle: Goal: Chance of risk for complications during the postpartum period will decrease Outcome: Completed/Met   Problem: Role Relationship: Goal: Ability to demonstrate positive interaction with newborn will improve Outcome: Completed/Met   Problem: Skin Integrity: Goal: Demonstration of wound healing without infection will improve Outcome: Completed/Met

## 2023-12-31 LAB — GC/CHLAMYDIA PROBE AMP (~~LOC~~) NOT AT ARMC
Chlamydia: NEGATIVE
Comment: NEGATIVE
Comment: NORMAL
Neisseria Gonorrhea: NEGATIVE

## 2024-01-14 ENCOUNTER — Telehealth (HOSPITAL_COMMUNITY): Payer: Self-pay | Admitting: *Deleted

## 2024-01-14 NOTE — Telephone Encounter (Signed)
 01/14/2024  Name: Carolyn Carroll MRN: [id number] DOB: May 28, 1995  Reason for Call:  Transition of Care Hospital Discharge Call  Contact Status: Patient Contact Status: Complete  Language assistant needed: Interpreter Mode: Interpreter Not Needed Interpreter Name: Tita [id number]        Follow-Up Questions: Do You Have Any Concerns About Your Health As You Heal From Delivery?: No Do You Have Any Concerns About Your Infants Health?: No  Edinburgh Postnatal Depression Scale:  In the Past 7 Days:    PHQ2-9 Depression Scale:     Discharge Follow-up: Edinburgh score requires follow up?:  (Patient says answers are the same as in the hospital when score was 0, endorses she is doing well emotionally.)  Post-discharge interventions: Reviewed Newborn Safe Sleep Practices  Mliss Sieve, RN 01/14/2024 14:04

## 2024-01-28 ENCOUNTER — Ambulatory Visit (INDEPENDENT_AMBULATORY_CARE_PROVIDER_SITE_OTHER): Payer: Self-pay | Admitting: Obstetrics & Gynecology

## 2024-01-28 ENCOUNTER — Encounter: Payer: Self-pay | Admitting: Obstetrics & Gynecology

## 2024-01-28 VITALS — BP 109/82 | HR 63 | Ht <= 58 in | Wt 126.0 lb

## 2024-01-28 DIAGNOSIS — Z3040 Encounter for surveillance of contraceptives, unspecified: Secondary | ICD-10-CM

## 2024-01-28 DIAGNOSIS — Z30017 Encounter for initial prescription of implantable subdermal contraceptive: Secondary | ICD-10-CM

## 2024-01-28 LAB — POCT PREGNANCY, URINE: Preg Test, Ur: NEGATIVE

## 2024-01-28 MED ORDER — ETONOGESTREL 68 MG ~~LOC~~ IMPL
68.0000 mg | DRUG_IMPLANT | Freq: Once | SUBCUTANEOUS | Status: AC
  Administered 2024-01-28: 68 mg via SUBCUTANEOUS

## 2024-01-28 NOTE — Progress Notes (Signed)
    Post Partum Visit Note  Carolyn Carroll is a 28 y.o. [phone] female who presents for a postpartum visit. She is 4 weeks postpartum following a normal spontaneous vaginal delivery.  I have fully reviewed the prenatal and intrapartum course. The delivery was at 37/2 gestational weeks.  Anesthesia: none. Postpartum course has been good. Baby is doing well. Baby is feeding by both breast and bottle - Similac Advance. Bleeding no bleeding. Bowel function is normal. Bladder function is normal. Patient is not sexually active. Contraception method is none. Postpartum depression screening: negative.   The pregnancy intention screening data noted above was reviewed. Potential methods of contraception were discussed. The patient elected to proceed with No data recorded.    Health Maintenance Due  Topic Date Due   Hepatitis B Vaccines 19-59 Average Risk (1 of 3 - 19+ 3-dose series) Never done   HPV VACCINES (1 - 3-dose SCDM series) Never done   COVID-19 Vaccine (1 - 2025-26 season) Never done    The following portions of the patient's history were reviewed and updated as appropriate: allergies, current medications, past family history, past medical history, past social history, past surgical history, and problem list.  Review of Systems Pertinent items are noted in HPI.  Objective:  LMP 04/11/2023    General:  alert, cooperative, and no distress   Breasts:  not indicated  Lungs:   Heart:  regular rate and rhythm  Abdomen: soft, non-tender; bowel sounds normal; no masses,  no organomegaly   Wound   GU exam:  not indicated       Assessment:    There are no diagnoses linked to this encounter.  normal postpartum exam.   Plan:   Essential components of care per ACOG recommendations:  1.  Mood and well being: Patient with negative depression screening today. Reviewed local resources for support.  - Patient tobacco use? No.   - hx of drug use? No.    2. Infant care and  feeding:  -Patient currently breastmilk feeding? Yes. Discussed returning to work and pumping.  -Social determinants of health (SDOH) reviewed in EPIC. No concerns  3. Sexuality, contraception and birth spacing - Patient does not want a pregnancy in the next year.  Desired family size is 5 children.  - Reviewed reproductive life planning. Reviewed contraceptive methods based on pt preferences and effectiveness.  Patient desired Hormonal Implant today.   - Discussed birth spacing of 18 months  4. Sleep and fatigue -Encouraged family/partner/community support of 4 hrs of uninterrupted sleep to help with mood and fatigue  5. Physical Recovery  - Discussed patients delivery and complications. She describes her labor as good. - Patient had a Vaginal, no problems at delivery. Patient had no laceration. Perineal healing reviewed. Patient expressed understanding - Patient has urinary incontinence? No. - Patient is safe to resume physical and sexual activity  6.  Health Maintenance - HM due items addressed Yes - Last pap smear No results found for: DIAGPAP Pap smear not done at today's visit.  -Breast Cancer screening indicated? No.   7. Chronic Disease/Pregnancy Condition follow up: None  - PCP follow up   Eveline Lynwood MATSU, MD [facility], [facility]

## 2024-01-28 NOTE — Progress Notes (Signed)
 GYNECOLOGY OFFICE PROCEDURE NOTE    Nexplanon  Insertion Procedure Patient identified, informed consent performed, consent signed.   Patient does understand that irregular bleeding is a very common side effect of this medication. She was advised to have backup contraception for one week after placement. Pregnancy test in clinic today was negative.  Appropriate time out taken.  Patient's left arm was prepped and draped in the usual sterile fashion. The ruler used to measure and mark insertion area.  Patient was prepped with alcohol swab and then injected with 3 ml of 1% lidocaine .  She was prepped with betadine, Nexplanon  removed from packaging,  Device confirmed in needle, then inserted full length of needle and withdrawn per handbook instructions. Nexplanon  was able to palpated in the patient's arm; patient palpated the insert herself. There was minimal blood loss.  Patient insertion site covered with gauze and a pressure bandage to reduce any bruising.  The patient tolerated the procedure well and was given post procedure instructions.   Eveline Lynwood MATSU, MD Attending Obstetrician & Gynecologist, [facility] and [facility]  01/28/2024
